# Patient Record
Sex: Female | Born: 1990 | Race: White | Hispanic: No | Marital: Married | Smoking: Never smoker
Health system: Southern US, Community
[De-identification: ages and names within clinical notes are randomized; demographics above are authoritative.]

## PROBLEM LIST (undated history)

## (undated) ENCOUNTER — Inpatient Hospital Stay (HOSPITAL_COMMUNITY): Payer: Self-pay

## (undated) ENCOUNTER — Inpatient Hospital Stay (HOSPITAL_COMMUNITY): Payer: Managed Care, Other (non HMO)

## (undated) DIAGNOSIS — L255 Unspecified contact dermatitis due to plants, except food: Secondary | ICD-10-CM

## (undated) DIAGNOSIS — I739 Peripheral vascular disease, unspecified: Secondary | ICD-10-CM

## (undated) DIAGNOSIS — R87629 Unspecified abnormal cytological findings in specimens from vagina: Secondary | ICD-10-CM

## (undated) DIAGNOSIS — J310 Chronic rhinitis: Secondary | ICD-10-CM

## (undated) DIAGNOSIS — R8789 Other abnormal findings in specimens from female genital organs: Secondary | ICD-10-CM

## (undated) DIAGNOSIS — B009 Herpesviral infection, unspecified: Secondary | ICD-10-CM

## (undated) DIAGNOSIS — I7300 Raynaud's syndrome without gangrene: Secondary | ICD-10-CM

## (undated) HISTORY — DX: Chronic rhinitis: J31.0

## (undated) HISTORY — DX: Raynaud's syndrome without gangrene: I73.00

## (undated) HISTORY — DX: Unspecified contact dermatitis due to plants, except food: L25.5

## (undated) HISTORY — DX: Other abnormal findings in specimens from female genital organs: R87.89

## (undated) HISTORY — DX: Herpesviral infection, unspecified: B00.9

## (undated) HISTORY — DX: Peripheral vascular disease, unspecified: I73.9

---
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
## 2008-05-17 ENCOUNTER — Ambulatory Visit: Payer: Self-pay | Admitting: Family Medicine

## 2008-08-30 ENCOUNTER — Ambulatory Visit: Payer: Self-pay | Admitting: Family Medicine

## 2008-10-31 ENCOUNTER — Ambulatory Visit: Payer: Self-pay | Admitting: Family Medicine

## 2008-10-31 DIAGNOSIS — L255 Unspecified contact dermatitis due to plants, except food: Secondary | ICD-10-CM

## 2008-11-03 ENCOUNTER — Telehealth: Payer: Self-pay | Admitting: Family Medicine

## 2009-02-01 ENCOUNTER — Ambulatory Visit: Payer: Self-pay | Admitting: Family Medicine

## 2009-02-01 DIAGNOSIS — I739 Peripheral vascular disease, unspecified: Secondary | ICD-10-CM

## 2009-02-01 DIAGNOSIS — M79609 Pain in unspecified limb: Secondary | ICD-10-CM

## 2009-02-01 LAB — CONVERTED CEMR LAB
Bilirubin Urine: NEGATIVE
Specific Gravity, Urine: 1.025
WBC Urine, dipstick: NEGATIVE
pH: 6

## 2009-02-02 LAB — CONVERTED CEMR LAB
Anti Nuclear Antibody(ANA): NEGATIVE
BUN: 12 mg/dL (ref 6–23)
Basophils Absolute: 0 K/uL (ref 0.0–0.1)
Basophils Relative: 1 % (ref 0–1)
CO2: 26 meq/L (ref 19–32)
Calcium: 9.6 mg/dL (ref 8.4–10.5)
Chloride: 102 meq/L (ref 96–112)
Creatinine, Ser: 0.7 mg/dL (ref 0.40–1.20)
Eosinophils Absolute: 0.2 K/uL (ref 0.0–0.7)
Eosinophils Relative: 2 % (ref 0–5)
Glucose, Bld: 78 mg/dL (ref 70–99)
HCT: 41.3 % (ref 36.0–46.0)
Hemoglobin: 13.8 g/dL (ref 12.0–15.0)
Lymphocytes Relative: 39 % (ref 12–46)
Lymphs Abs: 2.9 K/uL (ref 0.7–4.0)
MCHC: 33.4 g/dL (ref 30.0–36.0)
MCV: 88.6 fL (ref 78.0–100.0)
Monocytes Absolute: 0.7 K/uL (ref 0.1–1.0)
Monocytes Relative: 9 % (ref 3–12)
Neutro Abs: 3.6 K/uL (ref 1.7–7.7)
Neutrophils Relative %: 49 % (ref 43–77)
Platelets: 255 K/uL (ref 150–400)
Potassium: 4.2 meq/L (ref 3.5–5.3)
RBC: 4.66 M/uL (ref 3.87–5.11)
RDW: 13 % (ref 11.5–15.5)
Sed Rate: 7 mm/h (ref 0–22)
Sodium: 140 meq/L (ref 135–145)
WBC: 7.4 10*3/microliter (ref 4.0–10.5)

## 2009-02-03 ENCOUNTER — Ambulatory Visit: Payer: Self-pay | Admitting: Family Medicine

## 2009-02-03 DIAGNOSIS — I7300 Raynaud's syndrome without gangrene: Secondary | ICD-10-CM

## 2009-02-06 ENCOUNTER — Telehealth: Payer: Self-pay | Admitting: Family Medicine

## 2009-04-03 ENCOUNTER — Ambulatory Visit: Payer: Self-pay | Admitting: Family Medicine

## 2009-04-19 ENCOUNTER — Ambulatory Visit: Payer: Self-pay | Admitting: Family Medicine

## 2009-04-19 ENCOUNTER — Other Ambulatory Visit: Admission: RE | Admit: 2009-04-19 | Discharge: 2009-04-19 | Payer: Self-pay | Admitting: Family Medicine

## 2009-04-19 ENCOUNTER — Encounter: Payer: Self-pay | Admitting: Family Medicine

## 2009-04-19 DIAGNOSIS — M940 Chondrocostal junction syndrome [Tietze]: Secondary | ICD-10-CM

## 2009-04-20 ENCOUNTER — Encounter: Payer: Self-pay | Admitting: Family Medicine

## 2009-04-21 ENCOUNTER — Ambulatory Visit: Payer: Self-pay | Admitting: Family Medicine

## 2009-04-25 ENCOUNTER — Telehealth: Payer: Self-pay | Admitting: Family Medicine

## 2009-04-26 ENCOUNTER — Encounter: Payer: Self-pay | Admitting: Cardiology

## 2009-04-26 ENCOUNTER — Ambulatory Visit: Payer: Self-pay | Admitting: Cardiology

## 2009-04-26 DIAGNOSIS — R002 Palpitations: Secondary | ICD-10-CM | POA: Insufficient documentation

## 2009-04-26 DIAGNOSIS — R8789 Other abnormal findings in specimens from female genital organs: Secondary | ICD-10-CM

## 2009-05-09 ENCOUNTER — Ambulatory Visit: Payer: Self-pay | Admitting: Obstetrics & Gynecology

## 2009-05-31 ENCOUNTER — Encounter: Payer: Self-pay | Admitting: Cardiology

## 2009-05-31 ENCOUNTER — Ambulatory Visit: Payer: Self-pay | Admitting: Cardiology

## 2009-06-07 ENCOUNTER — Ambulatory Visit: Payer: Self-pay | Admitting: Family Medicine

## 2009-06-12 ENCOUNTER — Ambulatory Visit: Payer: Self-pay | Admitting: Family Medicine

## 2009-11-02 ENCOUNTER — Ambulatory Visit: Payer: Self-pay | Admitting: Family Medicine

## 2010-03-14 ENCOUNTER — Ambulatory Visit: Payer: Self-pay | Admitting: Family Medicine

## 2010-04-03 ENCOUNTER — Telehealth: Payer: Self-pay | Admitting: Family Medicine

## 2010-07-26 ENCOUNTER — Ambulatory Visit: Payer: Self-pay | Admitting: Family Medicine

## 2010-09-18 NOTE — Letter (Signed)
Summary: Physical Form/[facility]  Physical Form/[facility]   Imported By: Lanelle Bal 01/05/2010 11:12:32  _____________________________________________________________________  External Attachment:    Type:   Image     Comment:   External Document

## 2010-09-18 NOTE — Assessment & Plan Note (Signed)
Summary: costochondritis   Vital Signs:  Patient profile:   20 year old female Menstrual status:  regular Height:      64.75 inches Weight:      117 pounds BMI:     19.69 O2 Sat:      99 % on Room air Pulse rate:   85 / minute BP sitting:   113 / 72  (left arm) Cuff size:   regular  Vitals Entered By: Payton Spark CMA (March 14, 2010 10:20 AM)  O2 Flow:  Room air CC: costchondritis x 3 days.   Primary Care Provider:  Seymour Bars DO  CC:  costchondritis x 3 days.Marland Kitchen  History of Present Illness: 20 yo WF presents for L sided chest pain.  This started 3 days ago after push mowing the yard.  She has not been doing any weight lifting anymore.  She has a cold (working at a daycare).  Her symptoms have improved.  She feels SOB -- can't take a deep breath in due to pain.  Advil has helped.  Has pain with lifting anything heavy.    Has hx of costochondritis from last year.  Denies sputum production, asthma hx or cough.      Current Medications (verified): 1)  Nifedipine 2% Ketoprofen 2% Gabapentin 3% Ketamine 2% .... Apply To Feet 2-3 Times A Day. 2)  Apri 0.15-30 Mg-Mcg Tabs (Desogestrel-Ethinyl Estradiol) .Marland Kitchen.. 1 Tab By Mouth Daily As Directed 3)  Flonase 50 Mcg/act Susp (Fluticasone Propionate) .... 2 Sprays Per Nostril Daily 4)  Zyrtec Allergy 10 Mg Caps (Cetirizine Hcl) .Marland Kitchen.. 1 Cap By Mouth Qpm  Allergies (verified): No Known Drug Allergies  Past History:  Past Medical History: Reviewed history from 06/12/2009 and no changes required. 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 Lives with mom, older brother and younger brother. No smoking at home. Parents are divorced. She is in community college then going to [employer] Working at [employer]. Alcohol Use -  yes Part Time   Review of Systems      See HPI  Physical Exam  General:  alert, well-developed, well-nourished, and well-hydrated.   Head:  normocephalic and atraumatic.   Eyes:  conjunctiva clear Mouth:  pharynx pink and moist.   Neck:  no masses.   Chest Wall:  L anterior costochondral tenderness over ribs 3-5 Lungs:  Normal respiratory effort, chest expands symmetrically. Lungs are clear to auscultation, no crackles or wheezes.  splinting on deep inspiration Heart:  Normal rate and regular rhythm. S1 and S2 normal without gallop, murmur, click, rub or other extra sounds. Msk:  full L UE ROM and strength Extremities:  no E/C/C Skin:  color normal.  no bruising or redness Cervical Nodes:  shotty anterior cervical chain LA Psych:  good eye contact, not anxious appearing, and not depressed appearing.     Impression & Recommendations:  Problem # 1:  COSTOCHONDRITIS (ICD-733.6) Secondary to push mowing and gardening 3 days ago. Treat with 10-14 days of RX NSAID, avoidance of heavy lifting , pushing or pulling and use of heating pad. May be able to do PT to prevent recurrence (since this is her 3rd episode). Call if not improving in 2-3 wks.  Complete Medication List: 1)  Nifedipine 2% Ketoprofen 2% Gabapentin 3% Ketamine 2%  .... Apply to feet 2-3 times a day. 2)  Apri  0.15-30 Mg-mcg Tabs (Desogestrel-ethinyl estradiol) .Marland Kitchen.. 1 tab by mouth daily as directed 3)  Flonase 50 Mcg/act Susp (Fluticasone propionate) .... 2 sprays per nostril daily 4)  Zyrtec Allergy 10 Mg Caps (Cetirizine hcl) .Marland Kitchen.. 1 cap by mouth qpm 5)  Ibuprofen 800 Mg Tabs (Ibuprofen) .Marland Kitchen.. 1 tab by mouth three times a day with meals x 14 days  Patient Instructions: 1)  Use RX ibuprofen 3 x a day with food x 10-14 days. 2)  Use heating pad for comfort.   3)  Avoid heavy upper body lifting. 4)  I will call you about starting PT program after inflammation has improved. Prescriptions: IBUPROFEN 800 MG TABS  (IBUPROFEN) 1 tab by mouth three times a day with meals x 14 days  #28 x 0   Entered and Authorized by:   Seymour Bars DO   Signed by:   Seymour Bars DO on 03/14/2010   Method used:   Electronically to        [facility]  [id number] (retail)       [address]       [place], [place]  [address]       Ph: [phone]       Fax: [phone]   RxID:   [id number]   Appended Document: costochondritis

## 2010-09-18 NOTE — Progress Notes (Signed)
Summary: Water therapy  Phone Note Call from Patient   Caller: Mom Summary of Call: Mother called asking if you think some form of water therapy will be better than PT for Pt. If so, mother would like referral. Please advise. Initial call taken by: Payton Spark CMA,  April 03, 2010 12:56 PM

## 2010-09-18 NOTE — Assessment & Plan Note (Signed)
Summary: poison oak   Vital Signs:  Patient profile:   20 year old female Menstrual status:  regular Height:      64.75 inches Weight:      118 pounds BMI:     19.86 O2 Sat:      99 % on Room air Pulse rate:   93 / minute BP sitting:   125 / 77  (left arm) Cuff size:   regular  Vitals Entered By: Payton Spark CMA (November 02, 2009 2:15 PM)  O2 Flow:  Room air CC: Poison oak B hands   Primary Care Provider:  Seymour Bars DO  CC:  Poison oak B hands.  History of Present Illness: 21 yo WF presents for poison oak on both hands after doing some gardening yesterday.  She had it really bad last year.  She used Ivarest this morning.  Not using anything else.  The rash burns and itches now.  Denies sore throat, runny nose or cough.    Current Medications (verified): 1)  Nifedipine 2% Ketoprofen 2% Gabapentin 3% Ketamine 2% .... Apply To Feet 2-3 Times A Day. 2)  Apri 0.15-30 Mg-Mcg Tabs (Desogestrel-Ethinyl Estradiol) .Marland Kitchen.. 1 Tab By Mouth Daily As Directed 3)  Flonase 50 Mcg/act Susp (Fluticasone Propionate) .... 2 Sprays Per Nostril Daily 4)  Zyrtec Allergy 10 Mg Caps (Cetirizine Hcl) .Marland Kitchen.. 1 Cap By Mouth Qpm  Allergies (verified): No Known Drug Allergies  Past History:  Past Medical History: Reviewed history from 06/12/2009 and no changes required. 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 Lives with mom, older brother and younger brother. No smoking at home. Parents are divorced. She is in community college then going to [employer] - [employer] Working at [employer]. Alcohol Use - yes Part Time   Review of Systems      See HPI  Physical Exam  General:  alert, well-developed, well-nourished, and well-hydrated.   Head:  normocephalic and atraumatic.   Nose:  no  rhinorrhea Mouth:  no lesions of the oral mucosa Neck:  no masses.   Lungs:  Normal respiratory effort, chest expands symmetrically. Lungs are clear to auscultation, no crackles or wheezes. Heart:  Normal rate and regular rhythm. S1 and S2 normal without gallop, murmur, click, rub or other extra sounds. Skin:  color normal.  tiny erythmatous vesicular linear rash on both hands including web spaces Cervical Nodes:  No lymphadenopathy noted   Impression & Recommendations:  Problem # 1:  RHUS DERMATITIS (ICD-692.6)  Treat with solumedrol 125 mg IM today followed by oral steroid taper tomorrow. Wash with luke warm water and soap.  Apply topical caladryl to dry out lesions. Avoid further contacts with poison oak. Her updated medication list for this problem includes:    Zyrtec Allergy 10 Mg Caps (Cetirizine hcl) .Marland Kitchen... 1 cap by mouth qpm    Prednisone 20 Mg Tabs (Prednisone) .Marland Kitchen... 2 tabs by mouth qam x 3 days then 1 tab by mouth qam x 3 days then 1/2 tab by mouth qam x 3 days  Orders: Solumedrol up to 125mg  (E4540) Admin of Therapeutic Inj  intramuscular or subcutaneous (98119)  Complete Medication List: 1)  Nifedipine 2% Ketoprofen 2% Gabapentin 3% Ketamine 2%  .... Apply to feet 2-3 times a day. 2)  Apri 0.15-30 Mg-mcg Tabs (Desogestrel-ethinyl estradiol) .Marland Kitchen.. 1 tab by mouth daily as directed 3)  Flonase 50 Mcg/act Susp (Fluticasone propionate) .... 2 sprays per nostril daily 4)  Zyrtec Allergy 10 Mg Caps (Cetirizine hcl) .Marland Kitchen.. 1 cap by mouth qpm 5)  Prednisone 20 Mg Tabs (Prednisone) .... 2 tabs by mouth qam x 3 days then 1 tab by mouth qam x 3 days then 1/2 tab by mouth qam x 3 days  Patient Instructions: 1)  Steroid shot today. 2)  Start prednisone taper tomorrow. 3)  Wash with lukewarm soap and water. 4)  Apply topical caladryl. 5)  Call if not improving after 7 days. Prescriptions: PREDNISONE 20 MG TABS (PREDNISONE) 2 tabs by mouth qAM x 3 days then 1 tab by mouth qAM x 3 days  then 1/2 tab by mouth qAM x 3 days  #11 tabs x 0   Entered and Authorized by:   Seymour Bars DO   Signed by:   Seymour Bars DO on 11/02/2009   Method used:   Electronically to        [facility]  [id number] (retail)       [address]       [place], [place]  [address]       Ph: [phone]       Fax: [phone]   RxID:   [id number]    Medication Administration  Injection # 1:    Medication: Solumedrol up to 125mg     Diagnosis: RHUS DERMATITIS (ICD-692.6)    Route: IM    Site: RUOQ gluteus    Exp Date: 03/2011    Lot #: [id number] [id number]    Patient tolerated injection without complications    Given by: Payton Spark CMA (November 02, 2009 3:07 PM)  Orders Added: 1)  Est. Patient Level III [25956] 2)  Solumedrol up to 125mg  [J2930] 3)  Admin of Therapeutic Inj  intramuscular or subcutaneous [38756]

## 2010-09-20 NOTE — Miscellaneous (Signed)
Summary: PT Discharge/[facility]  PT Discharge/[facility]   Imported By: Lanelle Bal 08/09/2010 13:59:41  _____________________________________________________________________  External Attachment:    Type:   Image     Comment:   External Document

## 2011-04-03 ENCOUNTER — Encounter: Payer: Self-pay | Admitting: Cardiology

## 2011-05-10 ENCOUNTER — Encounter: Payer: Self-pay | Admitting: Family Medicine

## 2011-05-10 ENCOUNTER — Ambulatory Visit (INDEPENDENT_AMBULATORY_CARE_PROVIDER_SITE_OTHER): Payer: BC Managed Care – PPO | Admitting: Family Medicine

## 2011-05-10 ENCOUNTER — Other Ambulatory Visit (HOSPITAL_COMMUNITY)
Admission: RE | Admit: 2011-05-10 | Discharge: 2011-05-10 | Disposition: A | Discharge status: Home / Self Care | Payer: BC Managed Care – PPO | Source: Ambulatory Visit | Attending: Family Medicine | Admitting: Family Medicine

## 2011-05-10 VITALS — BP 118/70 | HR 89 | Ht 64.0 in | Wt 123.0 lb

## 2011-05-10 DIAGNOSIS — Z01419 Encounter for gynecological examination (general) (routine) without abnormal findings: Secondary | ICD-10-CM | POA: Insufficient documentation

## 2011-05-10 DIAGNOSIS — Z113 Encounter for screening for infections with a predominantly sexual mode of transmission: Secondary | ICD-10-CM | POA: Insufficient documentation

## 2011-05-10 DIAGNOSIS — Z3009 Encounter for other general counseling and advice on contraception: Secondary | ICD-10-CM

## 2011-05-10 DIAGNOSIS — R87619 Unspecified abnormal cytological findings in specimens from cervix uteri: Secondary | ICD-10-CM

## 2011-05-10 DIAGNOSIS — Z309 Encounter for contraceptive management, unspecified: Secondary | ICD-10-CM

## 2011-05-10 MED ORDER — NORELGESTROMIN-ETH ESTRADIOL 150-35 MCG/24HR TD PTWK: 1.0000 | MEDICATED_PATCH | TRANSDERMAL | Status: DC

## 2011-05-10 NOTE — Progress Notes (Signed)
  Subjective:    Patient ID: Annette Koch, female    DOB: [birth year], 20 y.o.   MRN: [id number]  HPI Also wants to discuss birth control options. She is due for her pap. She never followed up for her abnormal pap.    Her partner has herpes and now she has it. She wants to get on something daily at one point but wants to wait on her new insurance.    Review of Systems     Objective:   Physical Exam  Constitutional: She is oriented to person, place, and time. She appears well-developed and well-nourished.  Genitourinary: Vagina normal and uterus normal. There is no rash, tenderness, lesion or injury on the right labia. There is no rash, tenderness, lesion or injury on the left labia. Cervix exhibits no motion tenderness and no friability. Right adnexum displays no mass, no tenderness and no fullness. Left adnexum displays no mass, no tenderness and no fullness. No tenderness or bleeding around the vagina. No vaginal discharge found.  Neurological: She is alert and oriented to person, place, and time.          Assessment & Plan:  Contraceptive counseling - Discussed pills, patch, IUD, Mirena, implanon. Pt would like to try the patch. BP is well controlled. Call if any concerns.    Pap smear performed. Hx of abnormal. She never really followup.

## 2011-05-16 ENCOUNTER — Telehealth: Payer: Self-pay | Admitting: *Deleted

## 2011-05-16 NOTE — Telephone Encounter (Signed)
Message copied by Wyline Beady on Thu May 16, 2011  8:48 AM ------      Message from: Nani Gasser D      Created: Wed May 15, 2011  8:42 PM       Call pt: Pap is normal. Repeat in 1 years. Neg for GC and chlamydia.

## 2011-05-16 NOTE — Telephone Encounter (Signed)
Left message on vm

## 2011-06-29 ENCOUNTER — Emergency Department
Admit: 2011-06-29 | Discharge: 2011-06-29 | Disposition: A | Discharge status: Home / Self Care | Payer: BC Managed Care – PPO

## 2011-06-29 ENCOUNTER — Encounter: Payer: Self-pay | Admitting: Emergency Medicine

## 2011-06-29 ENCOUNTER — Emergency Department (INDEPENDENT_AMBULATORY_CARE_PROVIDER_SITE_OTHER)
Admission: EM | Admit: 2011-06-29 | Discharge: 2011-06-29 | Disposition: A | Discharge status: Home / Self Care | Payer: Self-pay | Source: Home / Self Care | Attending: Family Medicine | Admitting: Family Medicine

## 2011-06-29 DIAGNOSIS — S60222A Contusion of left hand, initial encounter: Secondary | ICD-10-CM

## 2011-06-29 DIAGNOSIS — S6992XA Unspecified injury of left wrist, hand and finger(s), initial encounter: Secondary | ICD-10-CM

## 2011-06-29 DIAGNOSIS — S60229A Contusion of unspecified hand, initial encounter: Secondary | ICD-10-CM
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
## 2011-07-05 ENCOUNTER — Ambulatory Visit (INDEPENDENT_AMBULATORY_CARE_PROVIDER_SITE_OTHER): Payer: BC Managed Care – PPO | Admitting: Family Medicine

## 2011-07-05 ENCOUNTER — Encounter: Payer: Self-pay | Admitting: Family Medicine

## 2011-07-05 DIAGNOSIS — R21 Rash and other nonspecific skin eruption: Secondary | ICD-10-CM

## 2011-07-05 DIAGNOSIS — Z309 Encounter for contraceptive management, unspecified: Secondary | ICD-10-CM

## 2011-07-05 MED ORDER — PREDNISONE 20 MG PO TABS: 40.0000 mg | ORAL_TABLET | Freq: Every day | ORAL | Status: DC

## 2011-07-05 NOTE — Progress Notes (Signed)
  Subjective:    Patient ID: Annette Koch, female    DOB: 1991/01/01, 20 y.o.   MRN: [id number]  HPI Rash sine last Tuesday. Has been using benadryl with no relief. Says it is very itchy. Started after cleaned out storage area. On her arms, back, torso, legs. Face is mostly spared.   Wants to discuss birth control options. She is interested in implanon. She is currently on the pill   Review of Systems     Objective:   Physical Exam  Constitutional: She is oriented to person, place, and time. She appears well-developed and well-nourished.  HENT:  Head: Normocephalic and atraumatic.  Cardiovascular: Normal rate, regular rhythm and normal heart sounds.   Pulmonary/Chest: Effort normal and breath sounds normal.  Neurological: She is alert and oriented to person, place, and time.  Skin: Skin is warm and dry.  Psychiatric: She has a normal mood and affect. Her behavior is normal.    Skin with fine pink papules on her arms, toso nad back. Most about about a 1 cm apart.       Assessment & Plan:  Rash- Likely allergic. Will tx wih oral prednisone since so diffuse on her body. Call if ot better in one week.   Contraceptive counseling - will refer to GYN for Implanon placement.

## 2011-07-10 ENCOUNTER — Other Ambulatory Visit: Payer: Self-pay | Admitting: Family Medicine

## 2011-07-17 ENCOUNTER — Ambulatory Visit (INDEPENDENT_AMBULATORY_CARE_PROVIDER_SITE_OTHER): Payer: BC Managed Care – PPO | Admitting: Family Medicine

## 2011-07-17 VITALS — BP 123/77 | HR 112 | Wt 122.0 lb

## 2011-07-17 DIAGNOSIS — R350 Frequency of micturition: Secondary | ICD-10-CM

## 2011-07-17 DIAGNOSIS — N390 Urinary tract infection, site not specified: Secondary | ICD-10-CM

## 2011-07-17 LAB — POCT URINALYSIS DIPSTICK
Bilirubin, UA: NEGATIVE
Glucose, UA: NEGATIVE
Spec Grav, UA: 1.015

## 2011-07-17 MED ORDER — SULFAMETHOXAZOLE-TRIMETHOPRIM 800-160 MG PO TABS: 1.0000 | ORAL_TABLET | Freq: Two times a day (BID) | ORAL | Status: AC

## 2011-07-17 NOTE — Progress Notes (Signed)
  Subjective:    Patient ID: Annette Koch, female    DOB: 01-25-1991, 20 y.o.   MRN: [id number] Pt here for urine sample. C/O burning with urination, urinary frequency, some hematuria, odor to urine. Sx's since yesterday HPI    Review of Systems     Objective:   Physical Exam        Assessment & Plan:  UTI- urinalysis is positive for blood and leukocytes. I will go ahead and treat for urinary tract infection. If her symptoms do not resolve after completing the antibiotic to please call her office. We'll treat with Bactrim one twice a day x3 days.

## 2011-07-18 NOTE — Progress Notes (Signed)
  Subjective:    Patient ID: Annette Koch, female    DOB: [birth year], 20 y.o.   MRN: [id number] Pt notified HPI    Review of Systems     Objective:   Physical Exam        Assessment & Plan:

## 2011-08-06 ENCOUNTER — Ambulatory Visit (INDEPENDENT_AMBULATORY_CARE_PROVIDER_SITE_OTHER): Payer: BC Managed Care – PPO | Admitting: Physician Assistant

## 2011-08-06 ENCOUNTER — Encounter: Payer: Self-pay | Admitting: Physician Assistant

## 2011-08-06 VITALS — BP 105/66 | HR 71 | Temp 98.5°F | Resp 16 | Ht 65.0 in | Wt 123.0 lb

## 2011-08-06 DIAGNOSIS — Z3009 Encounter for other general counseling and advice on contraception: Secondary | ICD-10-CM

## 2011-08-06 NOTE — Progress Notes (Signed)
Chief Complaint: Birth Control Consult  Annette Koch is  20 y.o. No obstetric history on file..  Patient's last menstrual period was 07/26/2011.. Presents to discuss Implanon for birth control. Recently married in November, currently using the [facility] patch. Desires change to Implanon for cost and ease of use.  Obstetrical/Gynecological History: OB History    Grav Para Term Preterm Abortions TAB SAB Ect Mult Living                 05/10/2011 Pap: NIL  Past Medical History: Past Medical History  Diagnosis Date  . PVD (peripheral vascular disease)     unspec  . Other abnormal Papanicolaou smear of cervix and cervical HPV   . Raynaud's disease   . Rhus dermatitis   . Rhinitis     allergic nos    Past Surgical History: Past Surgical History  Procedure Date  . Tonsillectomy and adenoidectomy 1994  . Cardiac surgery     Family History: Family History  Problem Relation Age of Onset  . Hypertension Maternal Grandmother     Social History: History  Substance Use Topics  . Smoking status: Never Smoker   . Smokeless tobacco: Not on file  . Alcohol Use: Yes     less often than weekly    Allergies: No Known Allergies   Review of Systems - Negative except what has been reviewed in the HPI History obtained from the patient  Physical Exam   Blood pressure 105/66, pulse 71, temperature 98.5 F (36.9 C), temperature source Oral, resp. rate 16, height 5\' 5"  (1.651 m), weight 123 lb (55.792 kg), last menstrual period 07/26/2011.  General: General appearance - alert, well appearing, and in no distress, oriented to person, place, and time and normal appearing weight Mental status - alert, oriented to person, place, and time, normal mood, behavior, speech, dress, motor activity, and thought processes, affect appropriate to mood Focused Gynecological Exam: examination not indicated     Assessment: Contraception Counselling  Plan: Reviewed Implanon procedure, bleeding  profile and side effects. Literature given for pt review RTC prn for placement with Dr. Penne Lash or Dr. Ranae Pila E. 08/06/2011,3:36 PM

## 2011-08-14 ENCOUNTER — Ambulatory Visit (INDEPENDENT_AMBULATORY_CARE_PROVIDER_SITE_OTHER): Payer: BC Managed Care – PPO | Admitting: Obstetrics & Gynecology

## 2011-08-14 ENCOUNTER — Encounter: Payer: Self-pay | Admitting: Obstetrics & Gynecology

## 2011-08-14 VITALS — BP 123/74 | HR 83 | Temp 97.7°F | Ht 64.0 in | Wt 125.0 lb

## 2011-08-14 DIAGNOSIS — Z30017 Encounter for initial prescription of implantable subdermal contraceptive: Secondary | ICD-10-CM

## 2011-08-14 MED ORDER — ETONOGESTREL 68 MG ~~LOC~~ IMPL
68.0000 mg | DRUG_IMPLANT | Freq: Once | SUBCUTANEOUS | Status: AC
  Administered 2011-08-14: 68 mg via SUBCUTANEOUS

## 2011-08-14 NOTE — Progress Notes (Signed)
Addended by: Chip Boer on: 08/14/2011 12:31 PM   Modules accepted: Orders

## 2011-08-14 NOTE — Progress Notes (Signed)
  Subjective:    Patient ID: Annette Koch, female    DOB: May 10, 1991, 20 y.o.   MRN: [id number]  HPI  She is here for Implanon insertion. She is aware that irregular bleeding is possible/likely.  Review of Systems Pap smear normal 9/12   Objective:   Physical Exam   Her left arm was prepped with betadine and infiltrated with 1% lidocaine. The implanon was inserted about 8 cm cephalad to the medial epicondyle. She tolerated the procedure well.     Assessment & Plan:  Contraception- Implanon inserted. RTC 1 year/prn sooner. She will continue to use condoms for 1 month.

## 2012-01-14 ENCOUNTER — Ambulatory Visit (INDEPENDENT_AMBULATORY_CARE_PROVIDER_SITE_OTHER): Payer: Managed Care, Other (non HMO) | Admitting: Family Medicine

## 2012-01-14 ENCOUNTER — Ambulatory Visit: Payer: Self-pay | Admitting: Family Medicine

## 2012-01-14 ENCOUNTER — Encounter: Payer: Self-pay | Admitting: Family Medicine

## 2012-01-14 VITALS — BP 118/74 | HR 69 | Temp 97.7°F | Ht 64.0 in | Wt 127.0 lb

## 2012-01-14 DIAGNOSIS — R0789 Other chest pain: Secondary | ICD-10-CM | POA: Insufficient documentation

## 2012-01-14 DIAGNOSIS — N61 Mastitis without abscess: Secondary | ICD-10-CM

## 2012-01-14 MED ORDER — DICLOXACILLIN SODIUM 250 MG PO CAPS: 250.0000 mg | ORAL_CAPSULE | Freq: Four times a day (QID) | ORAL | Status: AC

## 2012-01-14 NOTE — Progress Notes (Signed)
  Subjective:    Patient ID: Annette Koch, female    DOB: [birth year], 21 y.o.   MRN: [id number]  HPI Red spot on left breast for 3 days. Sore and tender.  Feels sensitive.  Now notices a knot there.  No family hx of breast cancer. No fever or trauma to the breast. She says she has been wearing sports bras recently then she is working at [employer]. She does not re-wear them.   Review of Systems     Objective:   Physical Exam  On the left breast at the 2:00 position there is a slight area of erythema. Does feel firm indurated underneath the skin in that area. It is tender to touch. I do not palpate any other nodules on the breast tissue. No axillary lymphadenopathy.      Assessment & Plan:  Mastitis - we will treat as an infection with dicloxacillin 250 mg 4 times a day x10 days. She has not had significant improvement, proximal is 75% within one week then please call the office back. At that time we'll get further imaging. No family history breast cancer and no other symptoms or signs to suspect this. Make sure wearing tight fitting bra. Make sure where brought in hot water to avoid bacterial contamination. I see no scan. She is not nursing.

## 2012-01-14 NOTE — Patient Instructions (Signed)
Mastitis   Mastitis is a bacterial infection of the breast tissue.  CAUSES   Bacteria causes infection by entering the breast tissue through cuts or openings in the skin. Typically, this occurs with breastfeeding due to cracked or irritated skin. It can be associated with plugged ducts. Nipple piercing can also lead to mastitis.  SYMPTOMS   In mastitis, an area of the breast becomes swollen, red, tender, and painful. You may notice you have a fever and swelling of the glands under your arm on that side. If the infection is allowed to progress, a collection of pus (abscess) may develop.  DIAGNOSIS   Your caregiver can diagnose mastitis based on your symptoms and upon examination. The diagnosis can be confirmed if pus can be expressed from the breast. This pus can be examined in the lab to determine which bacteria are present. If an abscess has developed, the fluid in the abscess can be removed with a needle. This is used to confirm the diagnosis and determine the bacteria present. In most cases, pus will not be present. Blood tests can be done to determine if your body is fighting a bacterial infection. Sometimes, a mammogram or ultrasound will be recommended to exclude other breast diseases including cancer.  Other rare forms of mastitis:   Tuberculosis mastitis is rare. The TB germ can affect the breast if it is present in some other part of the body. The breast may be slightly tender with a mass, but not tender or painful.   Syphilis of the nipple usually has an ulcer that is not tender.   Actinomycosis is a very rare bacterial infection of the breast that presents as a mass in the breast that is not tender or painful.   Phlebitis (inflammation of blood vessels) of the breast is an inflammation of the veins in the breast. It may be caused by tight fitting bras, surgery, or trauma to the breast.   Inflammatory carcinoma of the breast looks like mastitis because the breasts are red, swollen, or tender, but it  is a rare form of breast cancer.  TREATMENT   Antibiotic medication is used to treat the bacterial infection. Your caregiver will determine which bacteria are most likely to be causing the infection and select an antibiotic. This is sometimes changed based on the results of cultures, or if there is no response to the antibiotic selected. Antibiotics are usually given by mouth. If you are breastfeeding, it is important to continue to empty the breast. Your caregiver can tell you whether or not this milk is safe for your infant, or needs to be thrown away. Pain can usually be treated with medication.  HOME CARE INSTRUCTIONS    Take your antibiotics as directed. Finish them even if you start to feel better.   Only take over-the-counter or prescription medication for pain, discomfort, or fever as directed by your caregiver.   If breastfeeding, keep your nipples clean and dry. Your caregiver may tell you to stop nursing until he or she feels it is safe for your baby. Use a breast pump as instructed if forced to stop nursing.   Do not wear a tight bra. Wear a good support bra.   Empty the first breast completely before going to the other breast. If your baby is not emptying your breasts completely for some reason, use a breast pump to empty your breasts.   If you go back to work, pump your breasts while at work to stay in time   with your nursing schedule.   Increase your fluid intake especially if you have a fever.   Avoid having your breasts get overly filled with milk (engorged).  SEEK MEDICAL CARE IF:    You develop pus-like (purulent) discharge from the breast.   Your symptoms get worse.   You do not seem to be responding to your treatment within 2 days.  SEEK IMMEDIATE MEDICAL CARE IF:    You have a fever.   Your pain and swelling is getting worse.   You develop pain that is not controlled with medicine.   You develop a red line extending from the breast toward your armpit.  Document Released:  08/05/2005 Document Revised: 07/25/2011 Document Reviewed: 03/25/2008  [employer] Patient Information 2012 [employer].

## 2012-04-29 ENCOUNTER — Encounter: Payer: Self-pay | Admitting: Family Medicine

## 2012-04-29 ENCOUNTER — Ambulatory Visit (INDEPENDENT_AMBULATORY_CARE_PROVIDER_SITE_OTHER): Payer: Managed Care, Other (non HMO) | Admitting: Family Medicine

## 2012-04-29 VITALS — BP 118/73 | HR 81 | Temp 98.9°F | Wt 134.0 lb

## 2012-04-29 DIAGNOSIS — N912 Amenorrhea, unspecified: Secondary | ICD-10-CM

## 2012-04-29 DIAGNOSIS — M2550 Pain in unspecified joint: Secondary | ICD-10-CM

## 2012-04-29 DIAGNOSIS — Z0000 Encounter for general adult medical examination without abnormal findings: Secondary | ICD-10-CM

## 2012-04-29 LAB — COMPLETE METABOLIC PANEL WITH GFR
ALT: 18 U/L (ref 0–35)
CO2: 24 mEq/L (ref 19–32)
Calcium: 9.1 mg/dL (ref 8.4–10.5)
Chloride: 105 mEq/L (ref 96–112)
GFR, Est African American: >89 mL/min
Sodium: 139 mEq/L (ref 135–145)
Total Protein: 7.3 g/dL (ref 6.0–8.3)

## 2012-04-29 LAB — CBC WITH DIFFERENTIAL/PLATELET
Basophils Absolute: 0 10*3/uL (ref 0.0–0.1)
Eosinophils Absolute: 0.2 10*3/uL (ref 0.0–0.7)
Eosinophils Relative: 3 % (ref 0–5)
HCT: 42 % (ref 36.0–46.0)
Lymphocytes Relative: 33 % (ref 12–46)
MCH: 29.3 pg (ref 26.0–34.0)
MCHC: 33.8 g/dL (ref 30.0–36.0)
MCV: 86.8 fL (ref 78.0–100.0)
Monocytes Absolute: 0.6 10*3/uL (ref 0.1–1.0)
Platelets: 271 10*3/uL (ref 150–400)
RDW: 13.2 % (ref 11.5–15.5)
WBC: 7.6 10*3/uL (ref 4.0–10.5)

## 2012-04-29 LAB — LIPID PANEL
HDL: 48 mg/dL (ref >39)
LDL Cholesterol: 96 mg/dL (ref 0–99)

## 2012-04-29 LAB — HCG, QUANTITATIVE, PREGNANCY: hCG, Beta Chain, Quant, S: <2 m[IU]/mL

## 2012-04-29 LAB — POCT URINE PREGNANCY: Preg Test, Ur: POSITIVE

## 2012-04-29 NOTE — Patient Instructions (Addendum)
We will call you with your lab results. If you don't here from us in about a week then please give us a call at [phone]. Start a regular exercise program and make sure you are eating a healthy diet Try to eat 4 servings of dairy a day . Your vaccines are up to date.   

## 2012-04-29 NOTE — Progress Notes (Signed)
Subjective:     Annette Koch is a 21 y.o. female and is here for a comprehensive physical exam. The patient reports problems - thinks she is pregnant but has implannon in. Skipped her period but feels bloated and bresats are tender and has noticed some weight gain. Marland Kitchen  History   Social History  . Marital Status: Single    Spouse Name: N/A    Number of Children: N/A  . Years of Education: N/A   Occupational History  . Not on file.   Social History Main Topics  . Smoking status: Never Smoker   . Smokeless tobacco: Not on file  . Alcohol Use: Yes     less often than weekly  . Drug Use: No  . Sexually Active: Yes    Birth Control/ Protection: Condom   Other Topics Concern  . Not on file   Social History Narrative  . No narrative on file   Health Maintenance  Topic Date Due  . Influenza Vaccine  05/19/2012  . Pap Smear  05/09/2014  . Tetanus/tdap  08/19/2017    The following portions of the patient's history were reviewed and updated as appropriate: allergies, current medications, past family history, past medical history, past social history, past surgical history and problem list.  Review of Systems A comprehensive review of systems was negative.  except for HPI.   Objective:    BP 118/73  Pulse 81  Temp 98.9 F (37.2 C)  Wt 134 lb (60.782 kg)  LMP 03/19/2012 General appearance: alert, cooperative and appears stated age Head: Normocephalic, without obvious abnormality, atraumatic Eyes: conj clear, EOMI, PEERLA Ears: normal TM's and external ear canals both ears Nose: Nares normal. Septum midline. Mucosa normal. No drainage or sinus tenderness. Throat: lips, mucosa, and tongue normal; teeth and gums normal Neck: no adenopathy, no carotid bruit, no JVD, supple, symmetrical, trachea midline and thyroid not enlarged, symmetric, no tenderness/mass/nodules Back: symmetric, no curvature. ROM normal. No CVA tenderness. Lungs: clear to auscultation bilaterally Heart:  regular rate and rhythm, S1, S2 normal, no murmur, click, rub or gallop Abdomen: soft, non-tender; bowel sounds normal; no masses,  no organomegaly Extremities: extremities normal, atraumatic, no cyanosis or edema Pulses: 2+ and symmetric Skin: Skin color, texture, turgor normal. No rashes or lesions Lymph nodes: Cervical, supraclavicular, and axillary nodes normal. Neurologic: Alert and oriented X 3, normal strength and tone. Normal symmetric reflexes. Normal coordination and gait    Assessment:    Healthy female exam.      Plan:     See After Visit Summary for Counseling Recommendations  Start a regular exercise program and make sure you are eating a healthy diet Try to eat 4 servings of dairy a day or take a calcium supplement (500mg  twice a day). Your vaccines are up to date.   Amenorrhea-urine pregnancy test here was faintly positive. We'll get a serum quantitative for further evaluation. If it is positive then we'll need to get her to her GYN immediately to have her Implanon removed as well as get an ultrasound for dating. The first day of her last period was around August 4. The she has had very irregular periods and she had been on placed in December of 2012.  She also complains of some aching in her joints. Especially in her hands and her feet. She says that they do occasionally swell. She does have a history of Raynaud's but says this is different. She's also been told that she does not have  any type of a mean disorder. I'm assuming that means that her sedimentation rate etc. is normal. I'll recheck a sedimentation rate today.  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  She declined flu vaccine today.  Due for screening lipid panel.

## 2012-05-25 ENCOUNTER — Encounter: Payer: Self-pay | Admitting: Family Medicine

## 2012-05-25 ENCOUNTER — Ambulatory Visit (INDEPENDENT_AMBULATORY_CARE_PROVIDER_SITE_OTHER): Payer: Managed Care, Other (non HMO) | Admitting: Family Medicine

## 2012-05-25 VITALS — BP 112/64 | HR 77 | Temp 98.3°F | Wt 138.0 lb

## 2012-05-25 DIAGNOSIS — J069 Acute upper respiratory infection, unspecified: Secondary | ICD-10-CM

## 2012-05-25 DIAGNOSIS — M2550 Pain in unspecified joint: Secondary | ICD-10-CM

## 2012-05-25 NOTE — Patient Instructions (Signed)

## 2012-05-25 NOTE — Progress Notes (Signed)
Subjective:    Patient ID: Annette Koch, female    DOB: 1991-03-15, 21 y.o.   MRN: [id number]  HPI ST x 4 days. Very congested.  No fever.  + facial pressure.  Slight cough.  +HA.  No meds. No GI sxs.   Hands have been really sore and stiff and feels like it ishard to rip. Says some days will hurt and feel stiff all day. Unable to really quantify timeframe for stiffness. Says whole hand will swell, not distinct joints. Says thumbs and pointer fingers are the worse.  Hx of raynauds. No fam hx of rheumatoid arthritis.  Says this is not her raynauds. No other aggravating or alleviating symptoms.   Review of Systems     BP 112/64  Pulse 77  Temp 98.3 F (36.8 C)  Wt 138 lb (62.596 kg)  SpO2 96%  LMP 03/19/2012    No Known Allergies  Past Medical History  Diagnosis Date  . PVD (peripheral vascular disease)     unspec  . Other abnormal Papanicolaou smear of cervix and cervical HPV(795.09)   . Raynaud's disease   . Rhus dermatitis   . Rhinitis     allergic nos    Past Surgical History  Procedure Date  . Tonsillectomy and adenoidectomy 1994  . Cardiac surgery     History   Social History  . Marital Status: Single    Spouse Name: N/A    Number of Children: N/A  . Years of Education: N/A   Occupational History  . Not on file.   Social History Main Topics  . Smoking status: Never Smoker   . Smokeless tobacco: Not on file  . Alcohol Use: Yes     less often than weekly  . Drug Use: No  . Sexually Active: Yes    Birth Control/ Protection: Condom   Other Topics Concern  . Not on file   Social History Narrative  . No narrative on file    Family History  Problem Relation Age of Onset  . Hypertension Maternal Grandmother     Outpatient Encounter Prescriptions as of 05/25/2012  Medication Sig Dispense Refill  . Etonogestrel (IMPLANON [place]) Inject into the skin.           Objective:   Physical Exam  Constitutional: She is oriented to person, place, and  time. She appears well-developed and well-nourished.  HENT:  Head: Normocephalic and atraumatic.  Right Ear: External ear normal.  Left Ear: External ear normal.  Nose: Nose normal.  Mouth/Throat: Oropharynx is clear and moist.       TMs and canals are clear.   Eyes: Conjunctivae normal and EOM are normal. Pupils are equal, round, and reactive to light.  Neck: Neck supple. No thyromegaly present.  Cardiovascular: Normal rate, regular rhythm and normal heart sounds.   Pulmonary/Chest: Effort normal and breath sounds normal. She has no wheezes.  Musculoskeletal:       Hands appear normal with no swelling of the joints. Normal range of motion.  Lymphadenopathy:    She has cervical adenopathy.  Neurological: She is alert and oriented to person, place, and time.  Skin: Skin is warm and dry.  Psychiatric: She has a normal mood and affect.          Assessment & Plan:  URI - rapid strep neg. Likely viral. Call if not better by Friday, or sooner if she feels worse or has a fever.  Joint pain and stiffness - will check ANA,  sed rate, and rheumatoid factor.  Question whether not she could have some type of rheumatoid arthritis and she also has Raynaud's. Her ANA has been negative in the past. She has never had a rheumatoid factor checked. There's no swelling in her hands today.

## 2012-05-26 LAB — SEDIMENTATION RATE: Sed Rate: 4 mm/hr (ref 0–22)

## 2012-06-11 ENCOUNTER — Encounter: Payer: Self-pay | Admitting: Sports Medicine

## 2012-06-11 ENCOUNTER — Ambulatory Visit (INDEPENDENT_AMBULATORY_CARE_PROVIDER_SITE_OTHER): Payer: Managed Care, Other (non HMO) | Admitting: Sports Medicine

## 2012-06-11 ENCOUNTER — Ambulatory Visit (INDEPENDENT_AMBULATORY_CARE_PROVIDER_SITE_OTHER): Payer: Managed Care, Other (non HMO)

## 2012-06-11 VITALS — BP 122/68 | HR 91 | Wt 138.0 lb

## 2012-06-11 DIAGNOSIS — R209 Unspecified disturbances of skin sensation: Secondary | ICD-10-CM

## 2012-06-11 DIAGNOSIS — M7918 Myalgia, other site: Secondary | ICD-10-CM | POA: Insufficient documentation

## 2012-06-11 DIAGNOSIS — IMO0001 Reserved for inherently not codable concepts without codable children: Secondary | ICD-10-CM

## 2012-06-11 DIAGNOSIS — I7300 Raynaud's syndrome without gangrene: Secondary | ICD-10-CM

## 2012-06-11 MED ORDER — GABAPENTIN 300 MG PO CAPS: ORAL_CAPSULE | ORAL | Status: DC

## 2012-06-11 NOTE — Progress Notes (Signed)
Subjective:    I'm seeing this patient as a consultation for:  Dr. Linford Arnold  CC: Pains  HPI: Annette Koch is a very pleasant 21 year old female with a history of Raynaud's disease comes in with a long history of pain at multiple sites on her body. She notes pain in her neck, both shoulders, both hips. She also notes occasional burning/numbness/tingling type sensation that travels down both arms into both hands and the first 3 fingers. She denies any recent trauma. She has already been tested for mixed connective tissue and collagen vascular disease, and this negative. She is also seen a pediatrician who suspected fibromyalgia, however she has declined treatment. She denies any fevers, chills, night sweats, weight loss. She denies any tick bites. Symptoms are mild, and do not interfere with her daily activities.  Past medical history, Surgical history, Family history, Social history, Allergies, and medications have been entered into the medical record, reviewed, and no changes needed.   Review of Systems: No headache, visual changes, nausea, vomiting, diarrhea, constipation, dizziness, abdominal pain, skin rash, fevers, chills, night sweats, weight loss, swollen lymph nodes, body aches, joint swelling, muscle aches, chest pain, or shortness of breath.   Objective:   Vitals:  Afebrile, vital signs stable. General: Well Developed, well nourished, and in no acute distress.  Neuro/Psych: Alert and oriented x3, extra-ocular muscles intact, able to move all 4 extremities.  Skin: Warm and dry, no rashes noted.  Respiratory: Not using accessory muscles, speaking in full sentences, trachea midline.  Cardiovascular: Pulses palpable, no extremity edema. Abdomen: Does not appear distended. Neck: Inspection unremarkable. No palpable stepoffs. Negative Spurling's maneuver. Full neck range of motion Grip strength and sensation normal in bilateral hands Strength good C4 to T1 distribution No sensory change to C4  to T1 Negative Hoffman sign bilaterally Reflexes normal  Bilateral Shoulder: Inspection reveals no abnormalities, atrophy or asymmetry. Palpation is normal with no tenderness over AC joint or bicipital groove. ROM is full in all planes. Rotator cuff strength normal throughout. No signs of impingement with negative Neer and Hawkin's tests, empty can sign. Speeds and Yergason's tests normal. No labral pathology noted with negative Obrien's, negative clunk and good stability. Normal scapular function observed. No painful arc and no drop arm sign. No apprehension sign  Impression and Recommendations:   This case required medical decision making of moderate complexity.

## 2012-06-11 NOTE — Assessment & Plan Note (Signed)
Also discussed this, and her symptoms are not severe enough to warrant daily calcium channel blocker therapy.

## 2012-06-11 NOTE — Assessment & Plan Note (Signed)
Certainly with numbness and tingling in both hands, in a C6 distribution I do need to image her neck. I do however think that this will eventually represent a myofascial pain syndrome, and she did agree to start gabapentin taper. She declines Lyrica or Cymbalta. I would like to see her back in 4 weeks to see how things are going. Dr. Linford Arnold is already done an excellent job of ruling out various mixed connective tissue disorders.

## 2012-06-12 ENCOUNTER — Telehealth: Payer: Self-pay | Admitting: *Deleted

## 2012-06-12 NOTE — Telephone Encounter (Signed)
Pt asking about her xray results.

## 2012-06-12 NOTE — Telephone Encounter (Signed)
Looks ok except slight decreased disc space at the C7-T1 level.  Doesn't change our plan.

## 2012-06-15 NOTE — Telephone Encounter (Signed)
Pt informed

## 2012-07-09 ENCOUNTER — Encounter: Payer: Self-pay | Admitting: Sports Medicine

## 2012-07-09 ENCOUNTER — Ambulatory Visit (INDEPENDENT_AMBULATORY_CARE_PROVIDER_SITE_OTHER): Payer: Managed Care, Other (non HMO) | Admitting: Sports Medicine

## 2012-07-09 VITALS — BP 116/71 | HR 88 | Wt 139.0 lb

## 2012-07-09 DIAGNOSIS — IMO0001 Reserved for inherently not codable concepts without codable children: Secondary | ICD-10-CM

## 2012-07-09 DIAGNOSIS — M7918 Myalgia, other site: Secondary | ICD-10-CM

## 2012-07-09 NOTE — Progress Notes (Signed)
SPORTS MEDICINE CONSULTATION REPORT  Subjective:    CC: Myofascial pain  HPI: I am seeing Annette Koch for myofascial pain syndrome, I saw her recently and she had pain in her shoulders, with numbness and tingling running down both arms. We imaged her neck which was negative. I also started her on gabapentin up taper. She returns today with symptoms resolved, and very happy with results.  Past medical history, Surgical history, Family history, Social history, Allergies, and medications have been entered into the medical record, reviewed, and no changes needed.   Review of Systems: No headache, visual changes, nausea, vomiting, diarrhea, constipation, dizziness, abdominal pain, skin rash, fevers, chills, night sweats, weight loss, swollen lymph nodes, body aches, joint swelling, muscle aches, chest pain, or shortness of breath.   Objective:   Vitals:  Afebrile, vital signs stable. General: Well Developed, well nourished, and in no acute distress.  Neuro/Psych: Alert and oriented x3, extra-ocular muscles intact, able to move all 4 extremities.  Skin: Warm and dry, no rashes noted.  Respiratory: Not using accessory muscles, speaking in full sentences, trachea midline.  Cardiovascular: Pulses palpable, no extremity edema. Abdomen: Does not appear distended. Neck: Inspection unremarkable. No palpable stepoffs. Negative Spurling's maneuver. Full neck range of motion Grip strength and sensation normal in bilateral hands Strength good C4 to T1 distribution No sensory change to C4 to T1 Negative Hoffman sign bilaterally Reflexes normal  Impression and Recommendations:   This case required medical decision making of moderate complexity.

## 2012-07-09 NOTE — Assessment & Plan Note (Signed)
Symptoms are significantly better with low-dose gabapentin 300 mg 2-3 times per day. At this point I don't think that we need to increase her dose. We will continue from here with no changes, and I can see her back in 3 months. I happy to see her sooner if needed.

## 2012-10-05 ENCOUNTER — Ambulatory Visit (INDEPENDENT_AMBULATORY_CARE_PROVIDER_SITE_OTHER): Payer: Managed Care, Other (non HMO) | Admitting: Physician Assistant

## 2012-10-05 ENCOUNTER — Encounter: Payer: Self-pay | Admitting: Physician Assistant

## 2012-10-05 VITALS — BP 118/67 | HR 93 | Wt 134.0 lb

## 2012-10-05 DIAGNOSIS — J0100 Acute maxillary sinusitis, unspecified: Secondary | ICD-10-CM

## 2012-10-05 MED ORDER — AMOXICILLIN-POT CLAVULANATE 875-125 MG PO TABS: 1.0000 | ORAL_TABLET | Freq: Two times a day (BID) | ORAL | Status: DC

## 2012-10-05 NOTE — Patient Instructions (Addendum)

## 2012-10-05 NOTE — Progress Notes (Signed)
  Subjective:    Patient ID: Annette Koch, female    DOB: November 10, 1990, 22 y.o.   MRN: [id number]  Sinusitis This is a new problem. The current episode started in the past 7 days. The problem has been gradually worsening since onset. There has been no fever. Her pain is at a severity of 6/10. The pain is moderate. Associated symptoms include congestion, coughing, ear pain, headaches and sinus pressure. Pertinent negatives include no chills, diaphoresis, hoarse voice, neck pain, shortness of breath, sneezing, sore throat or swollen glands. (Cough is productive Bilateral ear pain with left greater than right with a lot of pressure. ) Treatments tried: Mucinex D.  The treatment provided mild (Felt a little better then started to get worse. ) relief.       Review of Systems  Constitutional: Negative for chills and diaphoresis.  HENT: Positive for ear pain, congestion and sinus pressure. Negative for sore throat, hoarse voice, sneezing and neck pain.   Respiratory: Positive for cough. Negative for shortness of breath.   Neurological: Positive for headaches.       Objective:   Physical Exam  Constitutional: She appears well-developed and well-nourished.  HENT:  Head: Normocephalic and atraumatic.  Right TM some dullness at the base. Still able to view ossicles and has a good light reflex. Left TM clear.   Oropharynx is erythematous with PND present no tonsils removed.   Maxillary tenderness bilaterally to palpation.   Eyes: Conjunctivae are normal. Right eye exhibits no discharge. Left eye exhibits no discharge.  Neck: Normal range of motion. Neck supple.  Cardiovascular: Normal rate, regular rhythm and normal heart sounds.   Pulmonary/Chest: Effort normal and breath sounds normal. She has no wheezes.  Lymphadenopathy:    She has no cervical adenopathy.  Skin: Skin is warm and dry. No rash noted. No erythema. No pallor.  Psychiatric: She has a normal mood and affect. Her behavior is  normal.          Assessment & Plan:  Sinusitis- Given Augmentin for 10 days. Pt already has nasal spray that she was instructed to use daily. Continue mucinex for next 24 hours. Gave handout. Call if not improving.

## 2012-10-09 ENCOUNTER — Ambulatory Visit: Payer: Managed Care, Other (non HMO) | Admitting: Sports Medicine

## 2012-10-09 DIAGNOSIS — Z0289 Encounter for other administrative examinations: Secondary | ICD-10-CM

## 2013-01-18 ENCOUNTER — Ambulatory Visit (INDEPENDENT_AMBULATORY_CARE_PROVIDER_SITE_OTHER): Payer: Managed Care, Other (non HMO) | Admitting: Family Medicine

## 2013-01-18 ENCOUNTER — Ambulatory Visit (HOSPITAL_BASED_OUTPATIENT_CLINIC_OR_DEPARTMENT_OTHER)
Admission: RE | Admit: 2013-01-18 | Discharge: 2013-01-18 | Disposition: A | Discharge status: Home / Self Care | Payer: Managed Care, Other (non HMO) | Source: Ambulatory Visit | Attending: Family Medicine | Admitting: Family Medicine

## 2013-01-18 ENCOUNTER — Encounter (HOSPITAL_BASED_OUTPATIENT_CLINIC_OR_DEPARTMENT_OTHER): Payer: Self-pay

## 2013-01-18 ENCOUNTER — Encounter: Payer: Self-pay | Admitting: Family Medicine

## 2013-01-18 VITALS — BP 111/79 | HR 78 | Wt 129.0 lb

## 2013-01-18 DIAGNOSIS — R1031 Right lower quadrant pain: Secondary | ICD-10-CM | POA: Insufficient documentation

## 2013-01-18 DIAGNOSIS — R110 Nausea: Secondary | ICD-10-CM | POA: Insufficient documentation

## 2013-01-18 DIAGNOSIS — R319 Hematuria, unspecified: Secondary | ICD-10-CM

## 2013-01-18 LAB — POCT URINALYSIS DIPSTICK
Bilirubin, UA: NEGATIVE
Glucose, UA: NEGATIVE
Ketones, UA: NEGATIVE
Leukocytes, UA: NEGATIVE
Nitrite, UA: NEGATIVE

## 2013-01-18 MED ORDER — IOHEXOL 300 MG/ML  SOLN
100.0000 mL | Freq: Once | INTRAMUSCULAR | Status: AC | PRN
  Administered 2013-01-18: 100 mL via INTRAVENOUS

## 2013-01-18 NOTE — Progress Notes (Signed)
Subjective:    Patient ID: Annette Koch, female    DOB: Sep 17, 1990, 22 y.o.   MRN: [id number]  HPI Having sharp RLQ Pain. Says feels similar to when had problems with her cyst. Says that is why she went on OCP. Hasn't felt nauseated with the pain. Has had loser bowels.  Says her period is ending. Last over a week and was heavier this time. Says not cramping.  No new sexual partners. No fever, chills, or sweats. Worse with standing for awhile. Feels better whenb sitting.  Stretching irritates it. More bloated than usual.  More uncomfortable if she jumps.    Review of Systems BP 111/79  Pulse 78  Wt 129 lb (58.514 kg)  BMI 22.13 kg/m2  LMP 01/11/2013    No Known Allergies  Past Medical History  Diagnosis Date  . PVD (peripheral vascular disease)     unspec  . Other abnormal Papanicolaou smear of cervix and cervical HPV(795.09)   . Raynaud's disease   . Rhus dermatitis   . Rhinitis     allergic nos    Past Surgical History  Procedure Laterality Date  . Tonsillectomy and adenoidectomy  1994  . Cardiac surgery      History   Social History  . Marital Status: Single    Spouse Name: N/A    Number of Children: N/A  . Years of Education: N/A   Occupational History  . Not on file.   Social History Main Topics  . Smoking status: Never Smoker   . Smokeless tobacco: Not on file  . Alcohol Use: Yes     Comment: less often than weekly  . Drug Use: No  . Sexually Active: Yes    Birth Control/ Protection: Condom   Other Topics Concern  . Not on file   Social History Narrative  . No narrative on file    Family History  Problem Relation Age of Onset  . Hypertension Maternal Grandmother     Outpatient Encounter Prescriptions as of 01/18/2013  Medication Sig Dispense Refill  . Etonogestrel (IMPLANON [place]) Inject into the skin.      Marland Kitchen gabapentin (NEURONTIN) 300 MG capsule One tab PO qHS for a week, then BID for a week, then TID.  May increase to a max of 4 tabs PO TID.   180 capsule  3  . [DISCONTINUED] amoxicillin-clavulanate (AUGMENTIN) 875-125 MG per tablet Take 1 tablet by mouth 2 (two) times daily. For 10 days.  20 tablet  0   No facility-administered encounter medications on file as of 01/18/2013.          Objective:   Physical Exam  Constitutional: She is oriented to person, place, and time. She appears well-developed and well-nourished.  HENT:  Head: Normocephalic and atraumatic.  Cardiovascular: Normal rate, regular rhythm and normal heart sounds.   Pulmonary/Chest: Effort normal and breath sounds normal.  Abdominal: Soft. Bowel sounds are normal. She exhibits no distension and no mass. There is tenderness. There is no rebound and no guarding.  TTP RLQ. No rebound or guarding.   Neurological: She is alert and oriented to person, place, and time.  Skin: Skin is warm and dry.  Psychiatric: She has a normal mood and affect. Her behavior is normal.          Assessment & Plan:  RLQ pain - Will schedule for CT to evaluate the appendix and the ovary.  Will do UPT first. UPT is neg. Will call with results. Can use  Tylenol or IBU.

## 2013-01-20 LAB — URINE CULTURE: Colony Count: NO GROWTH

## 2013-02-03 ENCOUNTER — Encounter: Payer: Self-pay | Admitting: Obstetrics & Gynecology

## 2013-02-03 ENCOUNTER — Ambulatory Visit (INDEPENDENT_AMBULATORY_CARE_PROVIDER_SITE_OTHER): Payer: Managed Care, Other (non HMO) | Admitting: Obstetrics & Gynecology

## 2013-02-03 VITALS — BP 116/75 | HR 80 | Resp 16 | Ht 64.0 in | Wt 129.0 lb

## 2013-02-03 DIAGNOSIS — Z3046 Encounter for surveillance of implantable subdermal contraceptive: Secondary | ICD-10-CM

## 2013-02-03 DIAGNOSIS — IMO0001 Reserved for inherently not codable concepts without codable children: Secondary | ICD-10-CM

## 2013-02-03 MED ORDER — NORELGESTROMIN-ETH ESTRADIOL 150-35 MCG/24HR TD PTWK: 1.0000 | MEDICATED_PATCH | TRANSDERMAL | Status: DC

## 2013-02-03 NOTE — Progress Notes (Signed)
  Subjective:    Patient ID: Annette Koch, female    DOB: 15-Jun-1991, 22 y.o.   MRN: [id number]  HPI  22 yo MWG0 who is here today to have her Nexplanon removed. She was diagnosed with an ovarian cyst at [facility] and she believes that by restarting Ortho [facility] she will cause her ovarian cyst to shrink faster. She and her husband were planning to attempt pregnancy after the cyst resolves. I have discussed that she should start folic acid/mvi now to prevent ONTDs. She is unsure whether or not she wants to restart the patch but she would like a prescription called to her pharmacy.   Review of Systems She says that she has completed her Gardasil series. Her pap is due.    Objective:   Physical Exam  Consent signed. Time out done. Her left arm was prepped with betadine and infiltrated with 1% lidocaine. A small incision was made and the entire Nexplanon was easily removed and noted to be intact. She tolerated the procedure well.      Assessment & Plan:  As above RTC 4 weeks for annual exam

## 2013-03-04 ENCOUNTER — Encounter: Payer: Self-pay | Admitting: Obstetrics & Gynecology

## 2013-03-04 ENCOUNTER — Ambulatory Visit (INDEPENDENT_AMBULATORY_CARE_PROVIDER_SITE_OTHER): Payer: Managed Care, Other (non HMO) | Admitting: Obstetrics & Gynecology

## 2013-03-04 VITALS — BP 118/77 | HR 81 | Resp 16 | Ht 64.5 in | Wt 129.0 lb

## 2013-03-04 DIAGNOSIS — Z01419 Encounter for gynecological examination (general) (routine) without abnormal findings: Secondary | ICD-10-CM

## 2013-03-04 DIAGNOSIS — Z113 Encounter for screening for infections with a predominantly sexual mode of transmission: Secondary | ICD-10-CM

## 2013-03-04 DIAGNOSIS — Z124 Encounter for screening for malignant neoplasm of cervix: Secondary | ICD-10-CM

## 2013-03-04 DIAGNOSIS — Z0000 Encounter for general adult medical examination without abnormal findings: Secondary | ICD-10-CM

## 2013-03-04 DIAGNOSIS — Z1151 Encounter for screening for human papillomavirus (HPV): Secondary | ICD-10-CM

## 2013-03-04 NOTE — Progress Notes (Signed)
Subjective:    Annette Koch is a 22 y.o. female who presents for an annual exam. The patient has no complaints today. She quit using her Ortho Evra last month, taking PNVs. The patient is sexually active. GYN screening history: last pap: was normal. The patient wears seatbelts: yes. The patient participates in regular exercise: yes. Has the patient ever been transfused or tattooed?: no. The patient reports that there is not domestic violence in her life.   Menstrual History: OB History   Grav Para Term Preterm Abortions TAB SAB Ect Mult Living   1    1 1     0      Menarche age: 28  Patient's last menstrual period was 02/21/2013.    The following portions of the patient's history were reviewed and updated as appropriate: allergies, current medications, past family history, past medical history, past social history, past surgical history and problem list.  Review of Systems A comprehensive review of systems was negative. She is a Printmaker.   Objective:    BP 118/77  Pulse 81  Resp 16  Ht 5' 4.5" (1.638 m)  Wt 129 lb (58.514 kg)  BMI 21.81 kg/m2  LMP 02/21/2013  General Appearance:    Alert, cooperative, no distress, appears stated age  Head:    Normocephalic, without obvious abnormality, atraumatic  Eyes:    PERRL, conjunctiva/corneas clear, EOM's intact, fundi    benign, both eyes  Ears:    Normal TM's and external ear canals, both ears  Nose:   Nares normal, septum midline, mucosa normal, no drainage    or sinus tenderness  Throat:   Lips, mucosa, and tongue normal; teeth and gums normal  Neck:   Supple, symmetrical, trachea midline, no adenopathy;    thyroid:  no enlargement/tenderness/nodules; no carotid   bruit or JVD  Back:     Symmetric, no curvature, ROM normal, no CVA tenderness  Lungs:     Clear to auscultation bilaterally, respirations unlabored  Chest Wall:    No tenderness or deformity   Heart:    Regular rate and rhythm, S1 and S2 normal, no murmur, rub    or gallop  Breast Exam:    No tenderness, masses, or nipple abnormality  Abdomen:     Soft, non-tender, bowel sounds active all four quadrants,    no masses, no organomegaly  Genitalia:    Normal female without lesion, discharge or tenderness, NSSR, NT, minimal mobility, normal adnexal exam     Extremities:   Extremities normal, atraumatic, no cyanosis or edema  Pulses:   2+ and symmetric all extremities  Skin:   Skin color, texture, turgor normal, no rashes or lesions  Lymph nodes:   Cervical, supraclavicular, and axillary nodes normal  Neurologic:   CNII-XII intact, normal strength, sensation and reflexes    throughout  .    Assessment:    Healthy female exam.    Plan:     Breast self exam technique reviewed and patient encouraged to perform self-exam monthly. Chlamydia specimen. GC specimen. Thin prep Pap smear.

## 2013-04-28 ENCOUNTER — Encounter: Payer: Managed Care, Other (non HMO) | Admitting: Family Medicine

## 2013-04-30 ENCOUNTER — Ambulatory Visit (INDEPENDENT_AMBULATORY_CARE_PROVIDER_SITE_OTHER): Payer: Managed Care, Other (non HMO) | Admitting: Family Medicine

## 2013-04-30 ENCOUNTER — Encounter: Payer: Self-pay | Admitting: Family Medicine

## 2013-04-30 VITALS — BP 107/72 | HR 82 | Temp 97.5°F | Wt 133.0 lb

## 2013-04-30 DIAGNOSIS — Z0000 Encounter for general adult medical examination without abnormal findings: Secondary | ICD-10-CM

## 2013-04-30 DIAGNOSIS — J019 Acute sinusitis, unspecified: Secondary | ICD-10-CM

## 2013-04-30 LAB — COMPLETE METABOLIC PANEL WITH GFR
ALT: 14 U/L (ref 0–35)
AST: 14 U/L (ref 0–37)
Albumin: 4.5 g/dL (ref 3.5–5.2)
Calcium: 9.1 mg/dL (ref 8.4–10.5)
Chloride: 102 mEq/L (ref 96–112)
Potassium: 3.9 mEq/L (ref 3.5–5.3)

## 2013-04-30 LAB — LIPID PANEL
LDL Cholesterol: 84 mg/dL (ref 0–99)
VLDL: 16 mg/dL (ref 0–40)

## 2013-04-30 MED ORDER — AMOXICILLIN-POT CLAVULANATE 875-125 MG PO TABS: 1.0000 | ORAL_TABLET | Freq: Two times a day (BID) | ORAL | Status: DC

## 2013-04-30 NOTE — Patient Instructions (Addendum)
Keep up a regular exercise program and make sure you are eating a healthy diet Try to eat 4 servings of dairy a day, or if you are lactose intolerant take a calcium with vitamin D daily.  Your vaccines are up to date.     Sinusitis Sinusitis is redness, soreness, and swelling (inflammation) of the paranasal sinuses. Paranasal sinuses are air pockets within the bones of your face (beneath the eyes, the middle of the forehead, or above the eyes). In healthy paranasal sinuses, mucus is able to drain out, and air is able to circulate through them by way of your nose. However, when your paranasal sinuses are inflamed, mucus and air can become trapped. This can allow bacteria and other germs to grow and cause infection. Sinusitis can develop quickly and last only a short time (acute) or continue over a long period (chronic). Sinusitis that lasts for more than 12 weeks is considered chronic.  CAUSES  Causes of sinusitis include:  Allergies.  Structural abnormalities, such as displacement of the cartilage that separates your nostrils (deviated septum), which can decrease the air flow through your nose and sinuses and affect sinus drainage.  Functional abnormalities, such as when the small hairs (cilia) that line your sinuses and help remove mucus do not work properly or are not present. SYMPTOMS  Symptoms of acute and chronic sinusitis are the same. The primary symptoms are pain and pressure around the affected sinuses. Other symptoms include:  Upper toothache.  Earache.  Headache.  Bad breath.  Decreased sense of smell and taste.  A cough, which worsens when you are lying flat.  Fatigue.  Fever.  Thick drainage from your nose, which often is green and may contain pus (purulent).  Swelling and warmth over the affected sinuses. DIAGNOSIS  Your caregiver will perform a physical exam. During the exam, your caregiver may:  Look in your nose for signs of abnormal growths in your nostrils  (nasal polyps).  Tap over the affected sinus to check for signs of infection.  View the inside of your sinuses (endoscopy) with a special imaging device with a light attached (endoscope), which is inserted into your sinuses. If your caregiver suspects that you have chronic sinusitis, one or more of the following tests may be recommended:  Allergy tests.  Nasal culture A sample of mucus is taken from your nose and sent to a lab and screened for bacteria.  Nasal cytology A sample of mucus is taken from your nose and examined by your caregiver to determine if your sinusitis is related to an allergy. TREATMENT  Most cases of acute sinusitis are related to a viral infection and will resolve on their own within 10 days. Sometimes medicines are prescribed to help relieve symptoms (pain medicine, decongestants, nasal steroid sprays, or saline sprays).  However, for sinusitis related to a bacterial infection, your caregiver will prescribe antibiotic medicines. These are medicines that will help kill the bacteria causing the infection.  Rarely, sinusitis is caused by a fungal infection. In theses cases, your caregiver will prescribe antifungal medicine. For some cases of chronic sinusitis, surgery is needed. Generally, these are cases in which sinusitis recurs more than 3 times per year, despite other treatments. HOME CARE INSTRUCTIONS   Drink plenty of water. Water helps thin the mucus so your sinuses can drain more easily.  Use a humidifier.  Inhale steam 3 to 4 times a day (for example, sit in the bathroom with the shower running).  Apply a warm, moist  washcloth to your face 3 to 4 times a day, or as directed by your caregiver.  Use saline nasal sprays to help moisten and clean your sinuses.  Take over-the-counter or prescription medicines for pain, discomfort, or fever only as directed by your caregiver. SEEK IMMEDIATE MEDICAL CARE IF:  You have increasing pain or severe headaches.  You  have nausea, vomiting, or drowsiness.  You have swelling around your face.  You have vision problems.  You have a stiff neck.  You have difficulty breathing. MAKE SURE YOU:   Understand these instructions.  Will watch your condition.  Will get help right away if you are not doing well or get worse. Document Released: 08/05/2005 Document Revised: 10/28/2011 Document Reviewed: 08/20/2011 [facility] Patient Information 2014 [place], [place].

## 2013-04-30 NOTE — Progress Notes (Signed)
  Subjective:     Annette Koch is a 22 y.o. female and is here for a comprehensive physical exam. The patient reports problems - nasal congestion x 2 weeks. Has ST and left ear pain.  No fever.  Says her teeth feel sore and cheeks hurt.  No cough. No GI sxs.  No OTC meds.  Did use Tylenol for ear pain.   History   Social History  . Marital Status: Married    Spouse Name: N/A    Number of Children: N/A  . Years of Education: N/A   Occupational History  . Not on file.   Social History Main Topics  . Smoking status: Never Smoker   . Smokeless tobacco: Never Used  . Alcohol Use: Yes     Comment: less often than weekly  . Drug Use: No  . Sexual Activity: Yes    Partners: Male    Birth Control/ Protection: Condom   Other Topics Concern  . Not on file   Social History Narrative  . No narrative on file   Health Maintenance  Topic Date Due  . Influenza Vaccine  03/19/2013  . Pap Smear  03/04/2016  . Tetanus/tdap  08/19/2017    The following portions of the patient's history were reviewed and updated as appropriate: allergies, current medications, past family history, past medical history, past social history, past surgical history and problem list.  Review of Systems A comprehensive review of systems was negative.   Objective:    BP 107/72  Pulse 82  Temp(Src) 97.5 F (36.4 C) (Oral)  Wt 133 lb (60.328 kg)  BMI 22.48 kg/m2 General appearance: alert, cooperative and appears stated age Head: Normocephalic, without obvious abnormality, atraumatic Eyes: conj clear, EOMi, PEERLA Ears: normal TM's and external ear canals both ears Nose: Nares normal. Septum midline. Mucosa normal. No drainage or sinus tenderness. Throat: lips, mucosa, and tongue normal; teeth and gums normal Neck: no adenopathy, no carotid bruit, no JVD, supple, symmetrical, trachea midline and thyroid not enlarged, symmetric, no tenderness/mass/nodules Back: symmetric, no curvature. ROM normal. No CVA  tenderness. Lungs: clear to auscultation bilaterally Heart: regular rate and rhythm, S1, S2 normal, no murmur, click, rub or gallop Abdomen: soft, non-tender; bowel sounds normal; no masses,  no organomegaly Extremities: extremities normal, atraumatic, no cyanosis or edema Pulses: 2+ and symmetric Skin: Skin color, texture, turgor normal. No rashes or lesions Lymph nodes: Cervical adenopathy: nl and Supraclavicular adenopathy: nl Neurologic: Alert and oriented X 3, normal strength and tone. Normal symmetric reflexes. Normal coordination and gait    Assessment:    Healthy female exam.    Plan:     See After Visit Summary for Counseling Recommendations  Keep up a regular exercise program and make sure you are eating a healthy diet Try to eat 4 servings of dairy a day, or if you are lactose intolerant take a calcium with vitamin D daily.  Your vaccines are up to date.    Acute sinusitis - will treat with Augmentin. Can use Tylenol as needed for fever and pain relief. Call if not significantly better in one week. Handout given.  She does have benign-appearing mole on her right upper back that she would like removed. Says it constantly hits her bra strapping gets irritated and sore. She has not had any bleeding or itching with it. She's had a similar mole removed on her lower back. She can schedule this at anytime.

## 2013-05-03 ENCOUNTER — Other Ambulatory Visit: Payer: Self-pay | Admitting: Family Medicine

## 2013-05-03 ENCOUNTER — Telehealth: Payer: Self-pay | Admitting: *Deleted

## 2013-05-03 MED ORDER — AZITHROMYCIN 250 MG PO TABS: ORAL_TABLET | ORAL | Status: DC

## 2013-05-03 NOTE — Telephone Encounter (Signed)
Pt called and stated that she is feeling worse. Dr. Linford Arnold called in a Zpack for her. Pt informed via vm.Annette Koch

## 2013-08-19 NOTE — L&D Delivery Note (Signed)
Delivery Summary for Annette Koch  Labor Events:   Preterm labor:   Rupture date:   Rupture time:   Rupture type: Spontaneous  Fluid Color: Light Meconium  Induction:   Augmentation:   Complications:   Cervical ripening:          Delivery:   Episiotomy:   Lacerations:   Repair suture:   Repair # of packets:   Blood loss (ml):    Information for the patient's newborn:  Amoura, Ransier [[id number]]    Delivery 04/27/2014 1:50 PM by  Vaginal, Spontaneous Delivery Sex:  female Gestational Age: <None> Delivery Clinician:  Kathie Dike Leftwich-Kirby Living?: Yes        APGARS  One minute Five minutes Ten minutes  Skin color: 1   1      Heart rate: 2   2      Grimace: 2   2      Muscle tone: 2   2      Breathing: 2   2      Totals: 9  9      Presentation/position: Vertex  Right Occiput Anterior Resuscitation: None  Cord information: 3 vessels   Disposition of cord blood: No    Blood gases sent? No Complications: None  Placenta: Delivered: 04/27/2014 2:03 PM  Spontaneous  Intact appearance Newborn Measurements: Weight:   Height:   Head circumference:   Chest circumference:   Other providers:  Edrick Oh Arisa Congleton C Jed Kutch  Additional  information: Forceps:   Vacuum:   Breech:   Observed anomalies        Delivery Note Active 2nd stage lasted 2 hours with reassuring FHR throughout. Birth in pool with Fatima Blank CNM assisting. I managed 3rd stage and did repair. At 1:50 PM a viable female was delivered via Vaginal, Spontaneous Delivery (Presentation: Right Occiput Anterior).  APGAR: 9, 9; weight .   Placenta status: Intact, Spontaneous.  Cord: 3 vessels with the following complications: None except tortuous vessel.  Anesthesia: Local  Episiotomy: None Lacerations: 2nd degree perineal and right labium at clitoral hood Suture Repair: 3.0 vicryl rapide 4-0 vicryl Est. Blood Loss (mL): 379ml  Mom to postpartum.  Baby to Couplet care / Skin to  Skin.  Ivet Guerrieri 04/27/2014, 2:45 PM

## 2013-08-19 NOTE — L&D Delivery Note (Signed)
Attestation of Attending Supervision of Advanced Practitioner (PA/CNM/NP): Evaluation and management procedures were performed by the Advanced Practitioner under my supervision and collaboration.  I have reviewed the Advanced Practitioner's note and chart, and I agree with the management and plan.  Aftyn Nott, DO Attending Physician Faculty Practice, [facility]  

## 2013-08-20 ENCOUNTER — Ambulatory Visit (INDEPENDENT_AMBULATORY_CARE_PROVIDER_SITE_OTHER): Payer: Managed Care, Other (non HMO) | Admitting: Family Medicine

## 2013-08-20 ENCOUNTER — Encounter: Payer: Self-pay | Admitting: Family Medicine

## 2013-08-20 VITALS — BP 127/76 | HR 107 | Ht 64.0 in | Wt 129.0 lb

## 2013-08-20 DIAGNOSIS — Z3490 Encounter for supervision of normal pregnancy, unspecified, unspecified trimester: Secondary | ICD-10-CM

## 2013-08-20 DIAGNOSIS — N912 Amenorrhea, unspecified: Secondary | ICD-10-CM

## 2013-08-20 DIAGNOSIS — Z3201 Encounter for pregnancy test, result positive: Secondary | ICD-10-CM

## 2013-08-20 LAB — POCT URINE PREGNANCY: Preg Test, Ur: POSITIVE

## 2013-08-20 MED ORDER — CONCEPT OB 130-92.4-1 MG PO CAPS: 1.0000 | ORAL_CAPSULE | Freq: Every day | ORAL | Status: DC

## 2013-08-20 NOTE — Progress Notes (Signed)
   Subjective:    Patient ID: Annette Koch, female    DOB: [birth year], 23 y.o.   MRN: [id number]  HPI pt has taken 2 home tests and they were positive she reports that she is trying to conceive. She has been trying to get pregnant. She is 3 days late for her current cycle. Previous cycle is normal. LMP 07/23/13.   She has felt well otherwise. She's not had any other nausea fatigue etc. She has very regular cycles.  Review of Systems     Objective:   Physical Exam  Constitutional: She is oriented to person, place, and time. She appears well-developed and well-nourished.  HENT:  Head: Normocephalic and atraumatic.  Eyes: Conjunctivae and EOM are normal.  Cardiovascular: Normal rate.   Pulmonary/Chest: Effort normal.  Neurological: She is alert and oriented to person, place, and time.  Skin: Skin is dry. No pallor.  Psychiatric: She has a normal mood and affect. Her behavior is normal.          Assessment & Plan:  Pregnancy-New Dx.  recommend serum pregnancy for confirmation. Also recommend PNV.  Samples given of concept OB.  EDD 04/29/14.  She is 4 weeks and 0 days. Inserts she. Will refer her to OB/GYN here in the building. She would like to have the baby at [facility] hospital.

## 2013-08-21 LAB — HCG, QUANTITATIVE, PREGNANCY: hCG, Beta Chain, Quant, S: 242.4 m[IU]/mL

## 2013-08-23 ENCOUNTER — Telehealth: Payer: Self-pay

## 2013-08-23 NOTE — Telephone Encounter (Signed)
Can run humidier, use vicks vapor rub.  Avoid delsym products.  Can use honey in hot tea.

## 2013-08-23 NOTE — Telephone Encounter (Signed)
Annette Koch is pregnant. She wants to know if there is something OTC for cough that would be safe.

## 2013-08-23 NOTE — Telephone Encounter (Signed)
Left detailed message.   

## 2013-09-20 ENCOUNTER — Ambulatory Visit (INDEPENDENT_AMBULATORY_CARE_PROVIDER_SITE_OTHER): Payer: Managed Care, Other (non HMO) | Admitting: Advanced Practice Midwife

## 2013-09-20 ENCOUNTER — Encounter: Payer: Self-pay | Admitting: Advanced Practice Midwife

## 2013-09-20 VITALS — BP 115/70 | Wt 130.0 lb

## 2013-09-20 DIAGNOSIS — R8789 Other abnormal findings in specimens from female genital organs: Secondary | ICD-10-CM

## 2013-09-20 DIAGNOSIS — Z3480 Encounter for supervision of other normal pregnancy, unspecified trimester: Secondary | ICD-10-CM

## 2013-09-20 DIAGNOSIS — Z3491 Encounter for supervision of normal pregnancy, unspecified, first trimester: Secondary | ICD-10-CM | POA: Insufficient documentation

## 2013-09-20 LAB — HIV ANTIBODY (ROUTINE TESTING W REFLEX): HIV: NONREACTIVE

## 2013-09-20 NOTE — Progress Notes (Signed)
p-91  Pt is here with husband for NOB intake.  Bedside U/S showed GA of [redacted]w[redacted]d and CRL 18.34mm and FHT 176 BPM

## 2013-09-20 NOTE — Progress Notes (Signed)
Subjective:    Annette Koch is being seen today for her first obstetrical visit.  This is a planned pregnancy. She is at [redacted]w[redacted]d gestation. Her obstetrical history is significant for nothing.. Relationship with FOB: spouse, living together. Patient does intend to breast feed. Pregnancy history fully reviewed. Pt has Hx genital HSV.   Menstrual History: OB History   Grav Para Term Preterm Abortions TAB SAB Ect Mult Living   2    1 1     0       Patient's last menstrual period was 07/23/2013.    The following portions of the patient's history were reviewed and updated as appropriate: allergies, current medications, past family history, past medical history, past social history, past surgical history and problem list.  Last Pap 02/2013 normal, neg HPV.   Review of Systems A comprehensive review of systems was negative. Neg for genital lesions or HSV prodrome.     Objective:    BP 115/70  Wt 130 lb (58.968 kg)  LMP 07/23/2013  General Appearance:    Alert, cooperative, no distress, appears stated age  Head:    Normocephalic, without obvious abnormality, atraumatic  Ears:    Normal TM's and external ear canals, both ears  Nose:   Nares normal, no drainage    or sinus tenderness  Throat:   Lips, mucosa, and tongue normal; teeth and gums normal  Back:     Symmetric, no curvature, ROM normal, no CVA tenderness  Lungs:     Clear to auscultation bilaterally, respirations unlabored  Chest Wall:    No tenderness or deformity   Heart:    Regular rate and rhythm, S1 and S2 normal, no murmur, rub   or gallop  Breast Exam:    No tenderness, masses, or nipple abnormality  Abdomen:     Soft, non-tender, bowel sounds active all four quadrants,    no masses, no organomegaly  Genitalia:    Declined  Extremities:   Extremities normal, atraumatic, no cyanosis or edema  Pulses:   2+ and symmetric all extremities  Skin:   Skin color, texture, turgor normal, no rashes or lesions  Lymph nodes:    Cervical, supraclavicular, and axillary nodes normal  Neurologic:   CNII-XII intact, normal strength, sensation and reflexes    throughout   Pos cardiac activity per informal BS [place].    Assessment:    Pregnancy at 8 and 3/7 weeks    Plan:    Initial labs drawn. Prenatal vitamins. Problem list reviewed and updated. AFP3 discussed: declined. Role of ultrasound in pregnancy discussed; fetal survey: requested. Amniocentesis discussed: not indicated. Follow up in 4 weeks. 75% of 30 min visit spent on counseling and coordination of care.   [place], [place] 09/20/2013 12:43 PM

## 2013-09-20 NOTE — Patient Instructions (Signed)
Pregnancy - First Trimester During sexual intercourse, millions of sperm go into the vagina. Only 1 sperm will penetrate and fertilize the female egg while it is in the Fallopian tube. One week later, the fertilized egg implants into the wall of the uterus. An embryo begins to develop into a baby. At 6 to 8 weeks, the eyes and face are formed and the heartbeat can be seen on ultrasound. At the end of 12 weeks (first trimester), all the baby's organs are formed. Now that you are pregnant, you will want to do everything you can to have a healthy baby. Two of the most important things are to get good prenatal care and follow your caregiver's instructions. Prenatal care is all the medical care you receive before the baby's birth. It is given to prevent, find, and treat problems during the pregnancy and childbirth. PRENATAL EXAMS  During prenatal visits, your weight, blood pressure, and urine are checked. This is done to make sure you are healthy and progressing normally during the pregnancy.  A pregnant woman should gain 25 to 35 pounds during the pregnancy. However, if you are overweight or underweight, your caregiver will advise you regarding your weight.  Your caregiver will ask and answer questions for you.  Blood work, cervical cultures, other necessary tests, and a Pap test are done during your prenatal exams. These tests are done to check on your health and the probable health of your baby. Tests are strongly recommended and done for HIV with your permission. This is the virus that causes AIDS. These tests are done because medicines can be given to help prevent your baby from being born with this infection should you have been infected without knowing it. Blood work is also used to find out your blood type, previous infections, and follow your blood levels (hemoglobin).  Low hemoglobin (anemia) is common during pregnancy. Iron and vitamins are given to help prevent this. Later in the pregnancy, blood  tests for diabetes will be done along with any other tests if any problems develop.  You may need other tests to make sure you and the baby are doing well. CHANGES DURING THE FIRST TRIMESTER  Your body goes through many changes during pregnancy. They vary from person to person. Talk to your caregiver about changes you notice and are concerned about. Changes can include:  Your menstrual period stops.  The egg and sperm carry the genes that determine what you look like. Genes from you and your partner are forming a baby. The female genes determine whether the baby is a boy or a girl.  Your body increases in girth and you may feel bloated.  Feeling sick to your stomach (nauseous) and throwing up (vomiting). If the vomiting is uncontrollable, call your caregiver.  Your breasts will begin to enlarge and become tender.  Your nipples may stick out more and become darker.  The need to urinate more. Painful urination may mean you have a bladder infection.  Tiring easily.  Loss of appetite.  Cravings for certain kinds of food.  At first, you may gain or lose a couple of pounds.  You may have changes in your emotions from day to day (excited to be pregnant or concerned something may go wrong with the pregnancy and baby).  You may have more vivid and strange dreams. HOME CARE INSTRUCTIONS   It is very important to avoid all smoking, alcohol and non-prescribed drugs during your pregnancy. These affect the formation and growth of the baby.   Avoid chemicals while pregnant to ensure the delivery of a healthy infant.  Start your prenatal visits by the 12th week of pregnancy. They are usually scheduled monthly at first, then more often in the last 2 months before delivery. Keep your caregiver's appointments. Follow your caregiver's instructions regarding medicine use, blood and lab tests, exercise, and diet.  During pregnancy, you are providing food for you and your baby. Eat regular, well-balanced  meals. Choose foods such as meat, fish, milk and other low fat dairy products, vegetables, fruits, and whole-grain breads and cereals. Your caregiver will tell you of the ideal weight gain.  You can help morning sickness by keeping soda crackers at the bedside. Eat a couple before arising in the morning. You may want to use the crackers without salt on them.  Eating 4 to 5 small meals rather than 3 large meals a day also may help the nausea and vomiting.  Drinking liquids between meals instead of during meals also seems to help nausea and vomiting.  A physical sexual relationship may be continued throughout pregnancy if there are no other problems. Problems may be early (premature) leaking of amniotic fluid from the membranes, vaginal bleeding, or belly (abdominal) pain.  Exercise regularly if there are no restrictions. Check with your caregiver or physical therapist if you are unsure of the safety of some of your exercises. Greater weight gain will occur in the last 2 trimesters of pregnancy. Exercising will help:  Control your weight.  Keep you in shape.  Prepare you for labor and delivery.  Help you lose your pregnancy weight after you deliver your baby.  Wear a good support or jogging bra for breast tenderness during pregnancy. This may help if worn during sleep too.  Ask when prenatal classes are available. Begin classes when they are offered.  Do not use hot tubs, steam rooms, or saunas.  Wear your seat belt when driving. This protects you and your baby if you are in an accident.  Avoid raw meat, uncooked cheese, cat litter boxes, and soil used by cats throughout the pregnancy. These carry germs that can cause birth defects in the baby.  The first trimester is a good time to visit your dentist for your dental health. Getting your teeth cleaned is okay. Use a softer toothbrush and brush gently during pregnancy.  Ask for help if you have financial, counseling, or nutritional needs  during pregnancy. Your caregiver will be able to offer counseling for these needs as well as refer you for other special needs.  Do not take any medicines or herbs unless told by your caregiver.  Inform your caregiver if there is any mental or physical domestic violence.  Make a list of emergency phone numbers of family, friends, hospital, and police and fire departments.  Write down your questions. Take them to your prenatal visit.  Do not douche.  Do not cross your legs.  If you have to stand for long periods of time, rotate you feet or take small steps in a circle.  You may have more vaginal secretions that may require a sanitary pad. Do not use tampons or scented sanitary pads. MEDICINES AND DRUG USE IN PREGNANCY  Take prenatal vitamins as directed. The vitamin should contain 1 milligram of folic acid. Keep all vitamins out of reach of children. Only a couple vitamins or tablets containing iron may be fatal to a baby or young child when ingested.  Avoid use of all medicines, including herbs, over-the-counter medicines, not   prescribed or suggested by your caregiver. Only take over-the-counter or prescription medicines for pain, discomfort, or fever as directed by your caregiver. Do not use aspirin, ibuprofen, or naproxen unless directed by your caregiver.  Let your caregiver also know about herbs you may be using.  Alcohol is related to a number of birth defects. This includes fetal alcohol syndrome. All alcohol, in any form, should be avoided completely. Smoking will cause low birth rate and premature babies.  Street or illegal drugs are very harmful to the baby. They are absolutely forbidden. A baby born to an addicted mother will be addicted at birth. The baby will go through the same withdrawal an adult does.  Let your caregiver know about any medicines that you have to take and for what reason you take them. SEEK MEDICAL CARE IF:  You have any concerns or worries during your  pregnancy. It is better to call with your questions if you feel they cannot wait, rather than worry about them. SEEK IMMEDIATE MEDICAL CARE IF:   An unexplained oral temperature above 102 F (38.9 C) develops, or as your caregiver suggests.  You have leaking of fluid from the vagina (birth canal). If leaking membranes are suspected, take your temperature and inform your caregiver of this when you call.  There is vaginal spotting or bleeding. Notify your caregiver of the amount and how many pads are used.  You develop a bad smelling vaginal discharge with a change in the color.  You continue to feel sick to your stomach (nauseated) and have no relief from remedies suggested. You vomit blood or coffee ground-like materials.  You lose more than 2 pounds of weight in 1 week.  You gain more than 2 pounds of weight in 1 week and you notice swelling of your face, hands, feet, or legs.  You gain 5 pounds or more in 1 week (even if you do not have swelling of your hands, face, legs, or feet).  You get exposed to [place] measles and have never had them.  You are exposed to fifth disease or chickenpox.  You develop belly (abdominal) pain. Round ligament discomfort is a common non-cancerous (benign) cause of abdominal pain in pregnancy. Your caregiver still must evaluate this.  You develop headache, fever, diarrhea, pain with urination, or shortness of breath.  You fall or are in a car accident or have any kind of trauma.  There is mental or physical violence in your home. Document Released: 07/30/2001 Document Revised: 04/29/2012 Document Reviewed: 01/31/2009 [facility] Patient Information 2014 [place].  Prenatal Care  WHAT IS PRENATAL CARE?  Prenatal care means health care during your pregnancy, before your baby is born. It is very important to take care of yourself and your baby during your pregnancy by:   Getting early prenatal care. If you know you are pregnant, or think you might  be pregnant, call your health care provider as soon as possible. Schedule a visit for a prenatal exam.  Getting regular prenatal care. Follow your health care provider's schedule for blood and other necessary tests. Do not miss appointments.  Doing everything you can to keep yourself and your baby healthy during your pregnancy.  Getting complete care. Prenatal care should include evaluation of the medical, dietary, educational, psychological, and social needs of you and your significant other. The medical and genetic history of your family and the family of your baby's father should be discussed with your health care provider.  Discussing with your health care provider:  Prescription, over-the-counter, and herbal medicines that you take.  Any history of substance abuse, alcohol use, smoking, and illegal drug use.  Any history of domestic abuse and violence.  Immunizations you have received.  Your nutrition and diet.  The amount of exercise you do.  Any environmental and occupational hazards to which you are exposed.  History of sexually transmitted infections for both you and your partner.  Previous pregnancies you have had. WHY IS PRENATAL CARE SO IMPORTANT?  By regularly seeing your health care provider, you help ensure that problems can be identified early so that they can be treated as soon as possible. Other problems might be prevented. Many studies have shown that early and regular prenatal care is important for the health of mothers and their babies.  HOW CAN I TAKE CARE OF MYSELF WHILE I AM PREGNANT?  Here are ways to take care of yourself and your baby:   Start or continue taking your multivitamin with 400 micrograms (mcg) of folic acid every day.  Get early and regular prenatal care. It is very important to see a health care provider during your pregnancy. Your health care provider will check at each visit to make sure that you and the baby are healthy. If there are any  problems, action can be taken right away to help you and the baby.  Eat a healthy diet that includes:  Fruits.  Vegetables.  Foods low in saturated fat.  Whole grains.  Calcium-rich foods, such as milk, yogurt, and hard cheeses.  Drink 6 to 8 glasses of liquids a day.  Unless your health care provider tells you not to, try to be physically active for 30 minutes, most days of the week. If you are pressed for time, you can get your activity in through 10-minute segments, three times a day.  Do not smoke, drink alcohol, or use drugs. These can cause long-term damage to your baby. Talk with your health care provider about steps to take to stop smoking. Talk with a member of your faith community, a counselor, a trusted friend, or your health care provider if you are concerned about your alcohol or drug use.  Ask your health care provider before taking any medicine, even over-the-counter medicines. Some medicines are not safe to take during pregnancy.  Get plenty of rest and sleep.  Avoid hot tubs and saunas during pregnancy.  Do not have X-rays taken unless absolutely necessary and with the recommendation of your health care provider. A lead shield can be placed on your abdomen to protect the baby when X-rays are taken in other parts of the body.  Do not empty the cat litter when you are pregnant. It may contain a parasite that causes an infection called toxoplasmosis, which can cause birth defects. Also, use gloves when working in garden areas used by cats.  Do not eat uncooked or undercooked meats or fish.  Do not eat soft, mold-ripened cheeses (Brie, Camembert, and chevre) or soft, blue-veined cheese (Danish blue and Roquefort).  Stay away from toxic chemicals like:  Insecticides.  Solvents (some cleaners or paint thinners).  Lead.  Mercury.  Sexual intercourse may continue until the end of the pregnancy, unless you have a medical problem or there is a problem with the  pregnancy and your health care provider tells you not to.  Do not wear high-heel shoes, especially during the second half of the pregnancy. You can lose your balance and fall.  Do not take long trips, unless absolutely  necessary. Be sure to see your health care provider before going on the trip.  Do not sit in one position for more than 2 hours when on a trip.  Take a copy of your medical records when going on a trip. Know where a hospital is located in the city you are visiting, in case of an emergency.  Most dangerous household products will have pregnancy warnings on their labels. Ask your health care provider about products if you are unsure.  Limit or eliminate your caffeine intake from coffee, tea, sodas, medicines, and chocolate.  Many women continue working through pregnancy. Staying active might help you stay healthier. If you have a question about the safety or the hours you work at your particular job, talk with your health care provider.  Get informed:  Read books.  Watch videos.  Go to childbirth classes for you and your significant other.  Talk with experienced moms.  Ask your health care provider about childbirth education classes for you and your partner. Classes can help you and your partner prepare for the birth of your baby.  Ask about a baby doctor (pediatrician) and methods and pain medicine for labor, delivery, and possible cesarean delivery. HOW OFTEN SHOULD I SEE MY HEALTH CARE PROVIDER DURING PREGNANCY?  Your health care provider will give you a schedule for your prenatal visits. You will have visits more often as you get closer to the end of your pregnancy. An average pregnancy lasts about 40 weeks.  A typical schedule includes visiting your health care provider:   About once each month during your first 6 months of pregnancy.  Every 2 weeks during the next 2 months.  Weekly in the last month, until the delivery date. Your health care provider will  probably want to see you more often if:  You are older than 35 years.  Your pregnancy is high risk because you have certain health problems or problems with the pregnancy, such as:  Diabetes.  High blood pressure.  The baby is not growing on schedule, according to the dates of the pregnancy. Your health care provider will do special tests to make sure you and the baby are not having any serious problems. WHAT HAPPENS DURING PRENATAL VISITS?   At your first prenatal visit, your health care provider will do a physical exam and talk to you about your health history and the health history of your partner and your family. Your health care provider will be able to tell you what date to expect your baby to be born on.  Your first physical exam will include checks of your blood pressure, measurements of your height and weight, and an exam of your pelvic organs. Your health care provider will do a Pap test if you have not had one recently and will do cultures of your cervix to make sure there is no infection.  At each prenatal visit, there will be tests of your blood, urine, blood pressure, weight, and checking the progress of the baby.  At your later prenatal visits, your health care provider will check how you are doing and how the baby is developing. You may have a number of tests done as your pregnancy progresses.  Ultrasound exams are often used to check on the baby's growth and health.  You may have more urine and blood tests, as well as special tests, if needed. These may include amniocentesis to examine fluid in the pregnancy sac, stress tests to check how the baby responds to  contractions, or a biophysical profile to measure fetus well-being. Your health care provider will explain the tests and why they are necessary.  You should discuss with your health care provider your plans to breastfeed or bottle-feed your baby.  Each visit is also a chance for you to learn about staying healthy  during pregnancy and to ask questions. Document Released: 08/08/2003 Document Revised: 05/26/2013 Document Reviewed: 01/21/2013 [facility] Patient Information 2014 [place], [place].

## 2013-09-21 LAB — OBSTETRIC PANEL
ANTIBODY SCREEN: NEGATIVE
BASOS PCT: 0 % (ref 0–1)
Basophils Absolute: 0 10*3/uL (ref 0.0–0.1)
Eosinophils Absolute: 0.1 10*3/uL (ref 0.0–0.7)
Eosinophils Relative: 1 % (ref 0–5)
HCT: 38.8 % (ref 36.0–46.0)
HEP B S AG: NEGATIVE
Hemoglobin: 13.5 g/dL (ref 12.0–15.0)
LYMPHS PCT: 20 % (ref 12–46)
Lymphs Abs: 2 10*3/uL (ref 0.7–4.0)
MCH: 29.2 pg (ref 26.0–34.0)
MCHC: 34.8 g/dL (ref 30.0–36.0)
MCV: 84 fL (ref 78.0–100.0)
MONO ABS: 0.8 10*3/uL (ref 0.1–1.0)
Monocytes Relative: 8 % (ref 3–12)
NEUTROS PCT: 71 % (ref 43–77)
Neutro Abs: 7.5 10*3/uL (ref 1.7–7.7)
PLATELETS: 309 10*3/uL (ref 150–400)
RBC: 4.62 MIL/uL (ref 3.87–5.11)
RDW: 13.8 % (ref 11.5–15.5)
RUBELLA: 1.43 {index} — AB (ref <0.90)
Rh Type: POSITIVE
WBC: 10.5 10*3/uL (ref 4.0–10.5)

## 2013-09-21 LAB — GC/CHLAMYDIA PROBE AMP
CT Probe RNA: NEGATIVE
GC Probe RNA: NEGATIVE

## 2013-09-22 LAB — CULTURE, OB URINE
Colony Count: NO GROWTH
ORGANISM ID, BACTERIA: NO GROWTH

## 2013-10-18 ENCOUNTER — Ambulatory Visit (INDEPENDENT_AMBULATORY_CARE_PROVIDER_SITE_OTHER): Payer: Managed Care, Other (non HMO) | Admitting: Advanced Practice Midwife

## 2013-10-18 ENCOUNTER — Encounter: Payer: Self-pay | Admitting: Advanced Practice Midwife

## 2013-10-18 VITALS — BP 110/72 | Wt 130.0 lb

## 2013-10-18 DIAGNOSIS — O98519 Other viral diseases complicating pregnancy, unspecified trimester: Principal | ICD-10-CM

## 2013-10-18 DIAGNOSIS — Z3480 Encounter for supervision of other normal pregnancy, unspecified trimester: Secondary | ICD-10-CM

## 2013-10-18 DIAGNOSIS — B009 Herpesviral infection, unspecified: Secondary | ICD-10-CM

## 2013-10-18 DIAGNOSIS — IMO0002 Reserved for concepts with insufficient information to code with codable children: Secondary | ICD-10-CM

## 2013-10-18 NOTE — Progress Notes (Signed)
Reviewed labs. Considering waterbirth. Scant post-coital spotting > 1 week ago. A pos. No other bleeding. Good FHR. Too early to check placentation. [place] precautions.

## 2013-10-18 NOTE — Progress Notes (Signed)
[id number] 

## 2013-10-18 NOTE — Patient Instructions (Addendum)
[facility]  [email or web address]  Coralee Rud)  Second Trimester of Pregnancy The second trimester is from week 13 through week 28, months 4 through 6. The second trimester is often a time when you feel your best. Your body has also adjusted to being pregnant, and you begin to feel better physically. Usually, morning sickness has lessened or quit completely, you may have more energy, and you may have an increase in appetite. The second trimester is also a time when the fetus is growing rapidly. At the end of the sixth month, the fetus is about 9 inches long and weighs about 1 pounds. You will likely begin to feel the baby move (quickening) between 18 and 20 weeks of the pregnancy. BODY CHANGES Your body goes through many changes during pregnancy. The changes vary from woman to woman.   Your weight will continue to increase. You will notice your lower abdomen bulging out.  You may begin to get stretch marks on your hips, abdomen, and breasts.  You may develop headaches that can be relieved by medicines approved by your caregiver.  You may urinate more often because the fetus is pressing on your bladder.  You may develop or continue to have heartburn as a result of your pregnancy.  You may develop constipation because certain hormones are causing the muscles that push waste through your intestines to slow down.  You may develop hemorrhoids or swollen, bulging veins (varicose veins).  You may have back pain because of the weight gain and pregnancy hormones relaxing your joints between the bones in your pelvis and as a result of a shift in weight and the muscles that support your balance.  Your breasts will continue to grow and be tender.  Your gums may bleed and may be sensitive to brushing and flossing.  Dark spots or blotches (chloasma, mask of pregnancy) may develop on your face. This will likely fade after the baby is born.  A dark line from your belly  button to the pubic area (linea nigra) may appear. This will likely fade after the baby is born. WHAT TO EXPECT AT YOUR PRENATAL VISITS During a routine prenatal visit:  You will be weighed to make sure you and the fetus are growing normally.  Your blood pressure will be taken.  Your abdomen will be measured to track your baby's growth.  The fetal heartbeat will be listened to.  Any test results from the previous visit will be discussed. Your caregiver may ask you:  How you are feeling.  If you are feeling the baby move.  If you have had any abnormal symptoms, such as leaking fluid, bleeding, severe headaches, or abdominal cramping.  If you have any questions. Other tests that may be performed during your second trimester include:  Blood tests that check for:  Low iron levels (anemia).  Gestational diabetes (between 24 and 28 weeks).  Rh antibodies.  Urine tests to check for infections, diabetes, or protein in the urine.  An ultrasound to confirm the proper growth and development of the baby.  An amniocentesis to check for possible genetic problems.  Fetal screens for spina bifida and Down syndrome. HOME CARE INSTRUCTIONS   Avoid all smoking, herbs, alcohol, and unprescribed drugs. These chemicals affect the formation and growth of the baby.  Follow your caregiver's instructions regarding medicine use. There are medicines that are either safe or unsafe to take during pregnancy.  Exercise only as directed by your caregiver. Experiencing uterine cramps is a good sign  to stop exercising.  Continue to eat regular, healthy meals.  Wear a good support bra for breast tenderness.  Do not use hot tubs, steam rooms, or saunas.  Wear your seat belt at all times when driving.  Avoid raw meat, uncooked cheese, cat litter boxes, and soil used by cats. These carry germs that can cause birth defects in the baby.  Take your prenatal vitamins.  Try taking a stool softener (if  your caregiver approves) if you develop constipation. Eat more high-fiber foods, such as fresh vegetables or fruit and whole grains. Drink plenty of fluids to keep your urine clear or pale yellow.  Take warm sitz baths to soothe any pain or discomfort caused by hemorrhoids. Use hemorrhoid cream if your caregiver approves.  If you develop varicose veins, wear support hose. Elevate your feet for 15 minutes, 3 4 times a day. Limit salt in your diet.  Avoid heavy lifting, wear low heel shoes, and practice good posture.  Rest with your legs elevated if you have leg cramps or low back pain.  Visit your dentist if you have not gone yet during your pregnancy. Use a soft toothbrush to brush your teeth and be gentle when you floss.  A sexual relationship may be continued unless your caregiver directs you otherwise.  Continue to go to all your prenatal visits as directed by your caregiver. SEEK MEDICAL CARE IF:   You have dizziness.  You have mild pelvic cramps, pelvic pressure, or nagging pain in the abdominal area.  You have persistent nausea, vomiting, or diarrhea.  You have a bad smelling vaginal discharge.  You have pain with urination. SEEK IMMEDIATE MEDICAL CARE IF:   You have a fever.  You are leaking fluid from your vagina.  You have spotting or bleeding from your vagina.  You have severe abdominal cramping or pain.  You have rapid weight gain or loss.  You have shortness of breath with chest pain.  You notice sudden or extreme swelling of your face, hands, ankles, feet, or legs.  You have not felt your baby move in over an hour.  You have severe headaches that do not go away with medicine.  You have vision changes. Document Released: 07/30/2001 Document Revised: 04/07/2013 Document Reviewed: 10/06/2012 [facility] Patient Information 2014 [place].

## 2013-11-15 ENCOUNTER — Ambulatory Visit (INDEPENDENT_AMBULATORY_CARE_PROVIDER_SITE_OTHER): Payer: Managed Care, Other (non HMO) | Admitting: Advanced Practice Midwife

## 2013-11-15 VITALS — BP 109/71 | Wt 131.0 lb

## 2013-11-15 DIAGNOSIS — Z3480 Encounter for supervision of other normal pregnancy, unspecified trimester: Secondary | ICD-10-CM

## 2013-11-15 DIAGNOSIS — Z3492 Encounter for supervision of normal pregnancy, unspecified, second trimester: Secondary | ICD-10-CM

## 2013-11-15 NOTE — Progress Notes (Signed)
[id number]

## 2013-11-15 NOTE — Progress Notes (Signed)
Doing well.  Denies vaginal bleeding, LOF, cramping/contractions.  Registered for waterbirth class and childbirth classes.

## 2013-11-15 NOTE — Progress Notes (Signed)
P=[id number] 

## 2013-11-18 ENCOUNTER — Ambulatory Visit: Payer: Managed Care, Other (non HMO) | Admitting: Family Medicine

## 2013-12-03 ENCOUNTER — Other Ambulatory Visit: Payer: Self-pay | Admitting: Advanced Practice Midwife

## 2013-12-03 ENCOUNTER — Ambulatory Visit (HOSPITAL_COMMUNITY)
Admission: RE | Admit: 2013-12-03 | Discharge: 2013-12-03 | Disposition: A | Discharge status: Home / Self Care | Payer: Managed Care, Other (non HMO) | Source: Ambulatory Visit | Attending: Advanced Practice Midwife | Admitting: Advanced Practice Midwife

## 2013-12-03 DIAGNOSIS — Z3689 Encounter for other specified antenatal screening: Secondary | ICD-10-CM | POA: Insufficient documentation

## 2013-12-03 DIAGNOSIS — Z3492 Encounter for supervision of normal pregnancy, unspecified, second trimester: Secondary | ICD-10-CM

## 2013-12-03 DIAGNOSIS — Z3491 Encounter for supervision of normal pregnancy, unspecified, first trimester: Secondary | ICD-10-CM

## 2013-12-13 ENCOUNTER — Ambulatory Visit (INDEPENDENT_AMBULATORY_CARE_PROVIDER_SITE_OTHER): Payer: Managed Care, Other (non HMO) | Admitting: Advanced Practice Midwife

## 2013-12-13 VITALS — BP 129/71 | HR 99 | Wt 138.0 lb

## 2013-12-13 DIAGNOSIS — Z3480 Encounter for supervision of other normal pregnancy, unspecified trimester: Secondary | ICD-10-CM

## 2013-12-13 DIAGNOSIS — Z3492 Encounter for supervision of normal pregnancy, unspecified, second trimester: Secondary | ICD-10-CM

## 2013-12-13 NOTE — Progress Notes (Signed)
Doing well.  Feeling daily fetal movement, denies vaginal bleeding, LOF, cramping/contractions.  Enrolled in waterbirth class and prenatal classes with doula who is providing her tub.

## 2014-01-14 ENCOUNTER — Ambulatory Visit (INDEPENDENT_AMBULATORY_CARE_PROVIDER_SITE_OTHER): Payer: Managed Care, Other (non HMO) | Admitting: Obstetrics & Gynecology

## 2014-01-14 VITALS — BP 125/84 | HR 115 | Wt 144.0 lb

## 2014-01-14 DIAGNOSIS — Z3491 Encounter for supervision of normal pregnancy, unspecified, first trimester: Secondary | ICD-10-CM

## 2014-01-14 DIAGNOSIS — Z3480 Encounter for supervision of other normal pregnancy, unspecified trimester: Secondary | ICD-10-CM

## 2014-01-14 NOTE — Progress Notes (Signed)
Routine visit. Good FM. No problems. Glucola at next visit.

## 2014-01-28 ENCOUNTER — Telehealth: Payer: Self-pay | Admitting: *Deleted

## 2014-01-28 NOTE — Telephone Encounter (Signed)
Pt called in states she has ran fever of 101.2 with sinus congestion and pain, bilateral ear pain. Pt has tried Tylenol for fever but returns once its time to take Tylenol again. Tea Tree essential oil has helped with ear pain but still painful. Pt does not have any pregnancy related complaints as in vaginal bleeding, contractions, etc. Spoke to midwife in office who suggested pt to be evaluated at [facility]. Advised pt to report to [facility] for eval. Pt expressed understanding.

## 2014-02-04 ENCOUNTER — Ambulatory Visit (INDEPENDENT_AMBULATORY_CARE_PROVIDER_SITE_OTHER): Payer: Managed Care, Other (non HMO) | Admitting: Advanced Practice Midwife

## 2014-02-04 ENCOUNTER — Encounter: Payer: Self-pay | Admitting: Advanced Practice Midwife

## 2014-02-04 VITALS — BP 107/68 | HR 85 | Wt 145.0 lb

## 2014-02-04 DIAGNOSIS — Z3483 Encounter for supervision of other normal pregnancy, third trimester: Secondary | ICD-10-CM

## 2014-02-04 DIAGNOSIS — Z3480 Encounter for supervision of other normal pregnancy, unspecified trimester: Secondary | ICD-10-CM

## 2014-02-04 LAB — CBC
HCT: 34.6 % — ABNORMAL LOW (ref 36.0–46.0)
Hemoglobin: 11.6 g/dL — ABNORMAL LOW (ref 12.0–15.0)
MCH: 29.7 pg (ref 26.0–34.0)
MCHC: 33.5 g/dL (ref 30.0–36.0)
MCV: 88.7 fL (ref 78.0–100.0)
Platelets: 290 10*3/uL (ref 150–400)
RBC: 3.9 MIL/uL (ref 3.87–5.11)
RDW: 14.3 % (ref 11.5–15.5)
WBC: 10.6 10*3/uL — ABNORMAL HIGH (ref 4.0–10.5)

## 2014-02-04 NOTE — Progress Notes (Signed)
28 week labs today.  

## 2014-02-04 NOTE — Progress Notes (Signed)
Glucola today. Feels well. Concerned about whether CNM will be available for waterbirth. Heard a story when a CNM was not available and wants to make sure.  Discussed issues, and that we make every attempt to have a CNM available.

## 2014-02-04 NOTE — Patient Instructions (Signed)
Third Trimester of Pregnancy The third trimester is from week 29 through week 42, months 7 through 9. The third trimester is a time when the fetus is growing rapidly. At the end of the ninth month, the fetus is about 20 inches in length and weighs 6-10 pounds.  BODY CHANGES Your body goes through many changes during pregnancy. The changes vary from woman to woman.   Your weight will continue to increase. You can expect to gain 25-35 pounds (11-16 kg) by the end of the pregnancy.  You may begin to get stretch marks on your hips, abdomen, and breasts.  You may urinate more often because the fetus is moving lower into your pelvis and pressing on your bladder.  You may develop or continue to have heartburn as a result of your pregnancy.  You may develop constipation because certain hormones are causing the muscles that push waste through your intestines to slow down.  You may develop hemorrhoids or swollen, bulging veins (varicose veins).  You may have pelvic pain because of the weight gain and pregnancy hormones relaxing your joints between the bones in your pelvis. Backaches may result from overexertion of the muscles supporting your posture.  You may have changes in your hair. These can include thickening of your hair, rapid growth, and changes in texture. Some women also have hair loss during or after pregnancy, or hair that feels dry or thin. Your hair will most likely return to normal after your baby is born.  Your breasts will continue to grow and be tender. A yellow discharge may leak from your breasts called colostrum.  Your belly button may stick out.  You may feel short of breath because of your expanding uterus.  You may notice the fetus "dropping," or moving lower in your abdomen.  You may have a bloody mucus discharge. This usually occurs a few days to a week before labor begins.  Your cervix becomes thin and soft (effaced) near your due date. WHAT TO EXPECT AT YOUR PRENATAL  EXAMS  You will have prenatal exams every 2 weeks until week 36. Then, you will have weekly prenatal exams. During a routine prenatal visit:  You will be weighed to make sure you and the fetus are growing normally.  Your blood pressure is taken.  Your abdomen will be measured to track your baby's growth.  The fetal heartbeat will be listened to.  Any test results from the previous visit will be discussed.  You may have a cervical check near your due date to see if you have effaced. At around 36 weeks, your caregiver will check your cervix. At the same time, your caregiver will also perform a test on the secretions of the vaginal tissue. This test is to determine if a type of bacteria, Group B streptococcus, is present. Your caregiver will explain this further. Your caregiver may ask you:  What your birth plan is.  How you are feeling.  If you are feeling the baby move.  If you have had any abnormal symptoms, such as leaking fluid, bleeding, severe headaches, or abdominal cramping.  If you have any questions. Other tests or screenings that may be performed during your third trimester include:  Blood tests that check for low iron levels (anemia).  Fetal testing to check the health, activity level, and growth of the fetus. Testing is done if you have certain medical conditions or if there are problems during the pregnancy. FALSE LABOR You may feel small, irregular contractions that   eventually go away. These are called Braxton Hicks contractions, or false labor. Contractions may last for hours, days, or even weeks before true labor sets in. If contractions come at regular intervals, intensify, or become painful, it is best to be seen by your caregiver.  SIGNS OF LABOR   Menstrual-like cramps.  Contractions that are 5 minutes apart or less.  Contractions that start on the top of the uterus and spread down to the lower abdomen and back.  A sense of increased pelvic pressure or back  pain.  A watery or bloody mucus discharge that comes from the vagina. If you have any of these signs before the 37th week of pregnancy, call your caregiver right away. You need to go to the hospital to get checked immediately. HOME CARE INSTRUCTIONS   Avoid all smoking, herbs, alcohol, and unprescribed drugs. These chemicals affect the formation and growth of the baby.  Follow your caregiver's instructions regarding medicine use. There are medicines that are either safe or unsafe to take during pregnancy.  Exercise only as directed by your caregiver. Experiencing uterine cramps is a good sign to stop exercising.  Continue to eat regular, healthy meals.  Wear a good support bra for breast tenderness.  Do not use hot tubs, steam rooms, or saunas.  Wear your seat belt at all times when driving.  Avoid raw meat, uncooked cheese, cat litter boxes, and soil used by cats. These carry germs that can cause birth defects in the baby.  Take your prenatal vitamins.  Try taking a stool softener (if your caregiver approves) if you develop constipation. Eat more high-fiber foods, such as fresh vegetables or fruit and whole grains. Drink plenty of fluids to keep your urine clear or pale yellow.  Take warm sitz baths to soothe any pain or discomfort caused by hemorrhoids. Use hemorrhoid cream if your caregiver approves.  If you develop varicose veins, wear support hose. Elevate your feet for 15 minutes, 3-4 times a day. Limit salt in your diet.  Avoid heavy lifting, wear low heal shoes, and practice good posture.  Rest a lot with your legs elevated if you have leg cramps or low back pain.  Visit your dentist if you have not gone during your pregnancy. Use a soft toothbrush to brush your teeth and be gentle when you floss.  A sexual relationship may be continued unless your caregiver directs you otherwise.  Do not travel far distances unless it is absolutely necessary and only with the approval  of your caregiver.  Take prenatal classes to understand, practice, and ask questions about the labor and delivery.  Make a trial run to the hospital.  Pack your hospital bag.  Prepare the baby's nursery.  Continue to go to all your prenatal visits as directed by your caregiver. SEEK MEDICAL CARE IF:  You are unsure if you are in labor or if your water has broken.  You have dizziness.  You have mild pelvic cramps, pelvic pressure, or nagging pain in your abdominal area.  You have persistent nausea, vomiting, or diarrhea.  You have a bad smelling vaginal discharge.  You have pain with urination. SEEK IMMEDIATE MEDICAL CARE IF:   You have a fever.  You are leaking fluid from your vagina.  You have spotting or bleeding from your vagina.  You have severe abdominal cramping or pain.  You have rapid weight loss or gain.  You have shortness of breath with chest pain.  You notice sudden or extreme swelling   of your face, hands, ankles, feet, or legs.  You have not felt your baby move in over an hour.  You have severe headaches that do not go away with medicine.  You have vision changes. Document Released: 07/30/2001 Document Revised: 08/10/2013 Document Reviewed: 10/06/2012 [employer] Patient Information 2015 [employer]. This information is not intended to replace advice given to you by your health care Haaris Metallo. Make sure you discuss any questions you have with your health care Alondra Vandeven.  

## 2014-02-05 LAB — RPR

## 2014-02-05 LAB — HIV ANTIBODY (ROUTINE TESTING W REFLEX): HIV 1&2 Ab, 4th Generation: NONREACTIVE

## 2014-02-05 LAB — GLUCOSE TOLERANCE, 1 HOUR (50G) W/O FASTING: Glucose, 1 Hour GTT: 90 mg/dL (ref 70–140)

## 2014-02-07 ENCOUNTER — Telehealth: Payer: Self-pay | Admitting: *Deleted

## 2014-02-07 NOTE — Telephone Encounter (Signed)
LM on voicemail of normal 1 hr GTT. 

## 2014-02-08 ENCOUNTER — Encounter (HOSPITAL_COMMUNITY): Payer: Self-pay | Admitting: Advanced Practice Midwife

## 2014-02-17 ENCOUNTER — Encounter: Payer: Managed Care, Other (non HMO) | Admitting: Obstetrics & Gynecology

## 2014-02-22 ENCOUNTER — Ambulatory Visit (INDEPENDENT_AMBULATORY_CARE_PROVIDER_SITE_OTHER): Payer: Managed Care, Other (non HMO) | Admitting: Obstetrics & Gynecology

## 2014-02-22 ENCOUNTER — Encounter: Payer: Self-pay | Admitting: Obstetrics & Gynecology

## 2014-02-22 VITALS — BP 128/73 | HR 116 | Wt 156.0 lb

## 2014-02-22 DIAGNOSIS — Z3480 Encounter for supervision of other normal pregnancy, unspecified trimester: Secondary | ICD-10-CM

## 2014-02-22 DIAGNOSIS — Z3491 Encounter for supervision of normal pregnancy, unspecified, first trimester: Secondary | ICD-10-CM

## 2014-02-22 NOTE — Progress Notes (Signed)
Routine visit. Good FM. No problems.  

## 2014-03-14 ENCOUNTER — Encounter: Payer: Self-pay | Admitting: *Deleted

## 2014-03-14 ENCOUNTER — Ambulatory Visit (INDEPENDENT_AMBULATORY_CARE_PROVIDER_SITE_OTHER): Payer: Managed Care, Other (non HMO) | Admitting: Advanced Practice Midwife

## 2014-03-14 VITALS — BP 121/75 | HR 92 | Wt 156.0 lb

## 2014-03-14 DIAGNOSIS — Z3483 Encounter for supervision of other normal pregnancy, third trimester: Secondary | ICD-10-CM

## 2014-03-14 DIAGNOSIS — Z3480 Encounter for supervision of other normal pregnancy, unspecified trimester: Secondary | ICD-10-CM

## 2014-03-14 NOTE — Patient Instructions (Signed)
Third Trimester of Pregnancy The third trimester is from week 29 through week 42, months 7 through 9. The third trimester is a time when the fetus is growing rapidly. At the end of the ninth month, the fetus is about 20 inches in length and weighs 6-10 pounds.  BODY CHANGES Your body goes through many changes during pregnancy. The changes vary from woman to woman.   Your weight will continue to increase. You can expect to gain 25-35 pounds (11-16 kg) by the end of the pregnancy.  You may begin to get stretch marks on your hips, abdomen, and breasts.  You may urinate more often because the fetus is moving lower into your pelvis and pressing on your bladder.  You may develop or continue to have heartburn as a result of your pregnancy.  You may develop constipation because certain hormones are causing the muscles that push waste through your intestines to slow down.  You may develop hemorrhoids or swollen, bulging veins (varicose veins).  You may have pelvic pain because of the weight gain and pregnancy hormones relaxing your joints between the bones in your pelvis. Backaches may result from overexertion of the muscles supporting your posture.  You may have changes in your hair. These can include thickening of your hair, rapid growth, and changes in texture. Some women also have hair loss during or after pregnancy, or hair that feels dry or thin. Your hair will most likely return to normal after your baby is born.  Your breasts will continue to grow and be tender. A yellow discharge may leak from your breasts called colostrum.  Your belly button may stick out.  You may feel short of breath because of your expanding uterus.  You may notice the fetus "dropping," or moving lower in your abdomen.  You may have a bloody mucus discharge. This usually occurs a few days to a week before labor begins.  Your cervix becomes thin and soft (effaced) near your due date. WHAT TO EXPECT AT YOUR PRENATAL  EXAMS  You will have prenatal exams every 2 weeks until week 36. Then, you will have weekly prenatal exams. During a routine prenatal visit:  You will be weighed to make sure you and the fetus are growing normally.  Your blood pressure is taken.  Your abdomen will be measured to track your baby's growth.  The fetal heartbeat will be listened to.  Any test results from the previous visit will be discussed.  You may have a cervical check near your due date to see if you have effaced. At around 36 weeks, your caregiver will check your cervix. At the same time, your caregiver will also perform a test on the secretions of the vaginal tissue. This test is to determine if a type of bacteria, Group B streptococcus, is present. Your caregiver will explain this further. Your caregiver may ask you:  What your birth plan is.  How you are feeling.  If you are feeling the baby move.  If you have had any abnormal symptoms, such as leaking fluid, bleeding, severe headaches, or abdominal cramping.  If you have any questions. Other tests or screenings that may be performed during your third trimester include:  Blood tests that check for low iron levels (anemia).  Fetal testing to check the health, activity level, and growth of the fetus. Testing is done if you have certain medical conditions or if there are problems during the pregnancy. FALSE LABOR You may feel small, irregular contractions that   eventually go away. These are called Braxton Hicks contractions, or false labor. Contractions may last for hours, days, or even weeks before true labor sets in. If contractions come at regular intervals, intensify, or become painful, it is best to be seen by your caregiver.  SIGNS OF LABOR   Menstrual-like cramps.  Contractions that are 5 minutes apart or less.  Contractions that start on the top of the uterus and spread down to the lower abdomen and back.  A sense of increased pelvic pressure or back  pain.  A watery or bloody mucus discharge that comes from the vagina. If you have any of these signs before the 37th week of pregnancy, call your caregiver right away. You need to go to the hospital to get checked immediately. HOME CARE INSTRUCTIONS   Avoid all smoking, herbs, alcohol, and unprescribed drugs. These chemicals affect the formation and growth of the baby.  Follow your caregiver's instructions regarding medicine use. There are medicines that are either safe or unsafe to take during pregnancy.  Exercise only as directed by your caregiver. Experiencing uterine cramps is a good sign to stop exercising.  Continue to eat regular, healthy meals.  Wear a good support bra for breast tenderness.  Do not use hot tubs, steam rooms, or saunas.  Wear your seat belt at all times when driving.  Avoid raw meat, uncooked cheese, cat litter boxes, and soil used by cats. These carry germs that can cause birth defects in the baby.  Take your prenatal vitamins.  Try taking a stool softener (if your caregiver approves) if you develop constipation. Eat more high-fiber foods, such as fresh vegetables or fruit and whole grains. Drink plenty of fluids to keep your urine clear or pale yellow.  Take warm sitz baths to soothe any pain or discomfort caused by hemorrhoids. Use hemorrhoid cream if your caregiver approves.  If you develop varicose veins, wear support hose. Elevate your feet for 15 minutes, 3-4 times a day. Limit salt in your diet.  Avoid heavy lifting, wear low heal shoes, and practice good posture.  Rest a lot with your legs elevated if you have leg cramps or low back pain.  Visit your dentist if you have not gone during your pregnancy. Use a soft toothbrush to brush your teeth and be gentle when you floss.  A sexual relationship may be continued unless your caregiver directs you otherwise.  Do not travel far distances unless it is absolutely necessary and only with the approval  of your caregiver.  Take prenatal classes to understand, practice, and ask questions about the labor and delivery.  Make a trial run to the hospital.  Pack your hospital bag.  Prepare the baby's nursery.  Continue to go to all your prenatal visits as directed by your caregiver. SEEK MEDICAL CARE IF:  You are unsure if you are in labor or if your water has broken.  You have dizziness.  You have mild pelvic cramps, pelvic pressure, or nagging pain in your abdominal area.  You have persistent nausea, vomiting, or diarrhea.  You have a bad smelling vaginal discharge.  You have pain with urination. SEEK IMMEDIATE MEDICAL CARE IF:   You have a fever.  You are leaking fluid from your vagina.  You have spotting or bleeding from your vagina.  You have severe abdominal cramping or pain.  You have rapid weight loss or gain.  You have shortness of breath with chest pain.  You notice sudden or extreme swelling   of your face, hands, ankles, feet, or legs.  You have not felt your baby move in over an hour.  You have severe headaches that do not go away with medicine.  You have vision changes. Document Released: 07/30/2001 Document Revised: 08/10/2013 Document Reviewed: 10/06/2012 [employer] Patient Information 2015 [employer]. This information is not intended to replace advice given to you by your health care provider. Make sure you discuss any questions you have with your health care provider.  

## 2014-03-14 NOTE — Progress Notes (Signed)
Brought birth plan today, will scan.  Wants noninterventive birth. Reviewed safety issues and reasons we do cervix checks, etc, but will try to limit them. Using [employer] and a doula.

## 2014-03-15 ENCOUNTER — Encounter: Payer: Self-pay | Admitting: *Deleted

## 2014-03-28 ENCOUNTER — Ambulatory Visit (INDEPENDENT_AMBULATORY_CARE_PROVIDER_SITE_OTHER): Payer: Managed Care, Other (non HMO) | Admitting: Advanced Practice Midwife

## 2014-03-28 VITALS — BP 114/68 | HR 93 | Wt 159.0 lb

## 2014-03-28 DIAGNOSIS — Z3491 Encounter for supervision of normal pregnancy, unspecified, first trimester: Secondary | ICD-10-CM

## 2014-03-28 DIAGNOSIS — O98313 Other infections with a predominantly sexual mode of transmission complicating pregnancy, third trimester: Secondary | ICD-10-CM

## 2014-03-28 DIAGNOSIS — A6000 Herpesviral infection of urogenital system, unspecified: Secondary | ICD-10-CM

## 2014-03-28 DIAGNOSIS — Z3480 Encounter for supervision of other normal pregnancy, unspecified trimester: Secondary | ICD-10-CM

## 2014-03-28 DIAGNOSIS — O98519 Other viral diseases complicating pregnancy, unspecified trimester: Secondary | ICD-10-CM

## 2014-03-28 DIAGNOSIS — A6009 Herpesviral infection of other urogenital tract: Secondary | ICD-10-CM

## 2014-03-28 MED ORDER — VALACYCLOVIR HCL 500 MG PO TABS: 500.0000 mg | ORAL_TABLET | Freq: Two times a day (BID) | ORAL | Status: DC

## 2014-03-28 NOTE — Progress Notes (Signed)
Declined TDaP. Active baby.

## 2014-03-28 NOTE — Patient Instructions (Signed)
Braxton Hicks Contractions Contractions of the uterus can occur throughout pregnancy. Contractions are not always a sign that you are in labor.  WHAT ARE BRAXTON HICKS CONTRACTIONS?  Contractions that occur before labor are called Braxton Hicks contractions, or false labor. Toward the end of pregnancy (32-34 weeks), these contractions can develop more often and may become more forceful. This is not true labor because these contractions do not result in opening (dilatation) and thinning of the cervix. They are sometimes difficult to tell apart from true labor because these contractions can be forceful and people have different pain tolerances. You should not feel embarrassed if you go to the hospital with false labor. Sometimes, the only way to tell if you are in true labor is for your health care provider to look for changes in the cervix. If there are no prenatal problems or other health problems associated with the pregnancy, it is completely safe to be sent home with false labor and await the onset of true labor. HOW CAN YOU TELL THE DIFFERENCE BETWEEN TRUE AND FALSE LABOR? False Labor  The contractions of false labor are usually shorter and not as hard as those of true labor.   The contractions are usually irregular.   The contractions are often felt in the front of the lower abdomen and in the groin.   The contractions may go away when you walk around or change positions while lying down.   The contractions get weaker and are shorter lasting as time goes on.   The contractions do not usually become progressively stronger, regular, and closer together as with true labor.  True Labor  Contractions in true labor last 30-70 seconds, become very regular, usually become more intense, and increase in frequency.   The contractions do not go away with walking.   The discomfort is usually felt in the top of the uterus and spreads to the lower abdomen and low back.   True labor can be  determined by your health care provider with an exam. This will show that the cervix is dilating and getting thinner.  WHAT TO REMEMBER  Keep up with your usual exercises and follow other instructions given by your health care provider.   Take medicines as directed by your health care provider.   Keep your regular prenatal appointments.   Eat and drink lightly if you think you are going into labor.   If Braxton Hicks contractions are making you uncomfortable:   Change your position from lying down or resting to walking, or from walking to resting.   Sit and rest in a tub of warm water.   Drink 2-3 glasses of water. Dehydration may cause these contractions.   Do slow and deep breathing several times an hour.  WHEN SHOULD I SEEK IMMEDIATE MEDICAL CARE? Seek immediate medical care if:  Your contractions become stronger, more regular, and closer together.   You have fluid leaking or gushing from your vagina.   You have a fever.   You pass blood-tinged mucus.   You have vaginal bleeding.   You have continuous abdominal pain.   You have low back pain that you never had before.   You feel your baby's head pushing down and causing pelvic pressure.   Your baby is not moving as much as it used to.  Document Released: 08/05/2005 Document Revised: 08/10/2013 Document Reviewed: 05/17/2013 Exit[employer] Patient Information 2015 [employer]. This information is not intended to replace advice given to you by your health care   provider. Make sure you discuss any questions you have with your health care provider.  Fetal Movement Counts Patient Name: __________________________________________________ Patient Due Date: ____________________ Performing a fetal movement count is highly recommended in high-risk pregnancies, but it is good for every pregnant woman to do. Your health care provider may ask you to start counting fetal movements at 28 weeks of the pregnancy. Fetal  movements often increase:  After eating a full meal.  After physical activity.  After eating or drinking something sweet or cold.  At rest. Pay attention to when you feel the baby is most active. This will help you notice a pattern of your baby's sleep and wake cycles and what factors contribute to an increase in fetal movement. It is important to perform a fetal movement count at the same time each day when your baby is normally most active.  HOW TO COUNT FETAL MOVEMENTS 1. Find a quiet and comfortable area to sit or lie down on your left side. Lying on your left side provides the best blood and oxygen circulation to your baby. 2. Write down the day and time on a sheet of paper or in a journal. 3. Start counting kicks, flutters, swishes, rolls, or jabs in a 2-hour period. You should feel at least 10 movements within 2 hours. 4. If you do not feel 10 movements in 2 hours, wait 2-3 hours and count again. Look for a change in the pattern or not enough counts in 2 hours. SEEK MEDICAL CARE IF:  You feel less than 10 counts in 2 hours, tried twice.  There is no movement in over an hour.  The pattern is changing or taking longer each day to reach 10 counts in 2 hours.  You feel the baby is not moving as he or she usually does. Date: ____________ Movements: ____________ Start time: ____________ Finish time: ____________  Date: ____________ Movements: ____________ Start time: ____________ Finish time: ____________ Date: ____________ Movements: ____________ Start time: ____________ Finish time: ____________ Date: ____________ Movements: ____________ Start time: ____________ Finish time: ____________ Date: ____________ Movements: ____________ Start time: ____________ Finish time: ____________ Date: ____________ Movements: ____________ Start time: ____________ Finish time: ____________ Date: ____________ Movements: ____________ Start time: ____________ Finish time: ____________ Date: ____________  Movements: ____________ Start time: ____________ Finish time: ____________  Date: ____________ Movements: ____________ Start time: ____________ Finish time: ____________ Date: ____________ Movements: ____________ Start time: ____________ Finish time: ____________ Date: ____________ Movements: ____________ Start time: ____________ Finish time: ____________ Date: ____________ Movements: ____________ Start time: ____________ Finish time: ____________ Date: ____________ Movements: ____________ Start time: ____________ Finish time: ____________ Date: ____________ Movements: ____________ Start time: ____________ Finish time: ____________ Date: ____________ Movements: ____________ Start time: ____________ Finish time: ____________  Date: ____________ Movements: ____________ Start time: ____________ Finish time: ____________ Date: ____________ Movements: ____________ Start time: ____________ Finish time: ____________ Date: ____________ Movements: ____________ Start time: ____________ Finish time: ____________ Date: ____________ Movements: ____________ Start time: ____________ Finish time: ____________ Date: ____________ Movements: ____________ Start time: ____________ Finish time: ____________ Date: ____________ Movements: ____________ Start time: ____________ Finish time: ____________ Date: ____________ Movements: ____________ Start time: ____________ Finish time: ____________  Date: ____________ Movements: ____________ Start time: ____________ Finish time: ____________ Date: ____________ Movements: ____________ Start time: ____________ Finish time: ____________ Date: ____________ Movements: ____________ Start time: ____________ Finish time: ____________ Date: ____________ Movements: ____________ Start time: ____________ Finish time: ____________ Date: ____________ Movements: ____________ Start time: ____________ Finish time: ____________ Date: ____________ Movements: ____________ Start time:  ____________ Finish time: ____________ Date: ____________ Movements:   ____________ Start time: ____________ Finish time: ____________  Date: ____________ Movements: ____________ Start time: ____________ Finish time: ____________ Date: ____________ Movements: ____________ Start time: ____________ Finish time: ____________ Date: ____________ Movements: ____________ Start time: ____________ Finish time: ____________ Date: ____________ Movements: ____________ Start time: ____________ Finish time: ____________ Date: ____________ Movements: ____________ Start time: ____________ Finish time: ____________ Date: ____________ Movements: ____________ Start time: ____________ Finish time: ____________ Date: ____________ Movements: ____________ Start time: ____________ Finish time: ____________  Date: ____________ Movements: ____________ Start time: ____________ Finish time: ____________ Date: ____________ Movements: ____________ Start time: ____________ Finish time: ____________ Date: ____________ Movements: ____________ Start time: ____________ Finish time: ____________ Date: ____________ Movements: ____________ Start time: ____________ Finish time: ____________ Date: ____________ Movements: ____________ Start time: ____________ Finish time: ____________ Date: ____________ Movements: ____________ Start time: ____________ Finish time: ____________ Date: ____________ Movements: ____________ Start time: ____________ Finish time: ____________  Date: ____________ Movements: ____________ Start time: ____________ Finish time: ____________ Date: ____________ Movements: ____________ Start time: ____________ Finish time: ____________ Date: ____________ Movements: ____________ Start time: ____________ Finish time: ____________ Date: ____________ Movements: ____________ Start time: ____________ Finish time: ____________ Date: ____________ Movements: ____________ Start time: ____________ Finish time: ____________ Date:  ____________ Movements: ____________ Start time: ____________ Finish time: ____________ Date: ____________ Movements: ____________ Start time: ____________ Finish time: ____________  Date: ____________ Movements: ____________ Start time: ____________ Finish time: ____________ Date: ____________ Movements: ____________ Start time: ____________ Finish time: ____________ Date: ____________ Movements: ____________ Start time: ____________ Finish time: ____________ Date: ____________ Movements: ____________ Start time: ____________ Finish time: ____________ Date: ____________ Movements: ____________ Start time: ____________ Finish time: ____________ Date: ____________ Movements: ____________ Start time: ____________ Finish time: ____________ Document Released: 09/04/2006 Document Revised: 12/20/2013 Document Reviewed: 06/01/2012 ExitCare Patient Information 2015 [employer]. This information is not intended to replace advice given to you by your health care provider. Make sure you discuss any questions you have with your health care provider.  

## 2014-03-28 NOTE — Addendum Note (Signed)
Addended by: Asencion Islam on: 03/28/2014 12:30 PM   Modules accepted: Orders

## 2014-04-04 ENCOUNTER — Ambulatory Visit (INDEPENDENT_AMBULATORY_CARE_PROVIDER_SITE_OTHER): Payer: Managed Care, Other (non HMO) | Admitting: Advanced Practice Midwife

## 2014-04-04 ENCOUNTER — Encounter: Payer: Self-pay | Admitting: Advanced Practice Midwife

## 2014-04-04 VITALS — BP 118/71 | HR 121 | Wt 158.0 lb

## 2014-04-04 DIAGNOSIS — Z3403 Encounter for supervision of normal first pregnancy, third trimester: Secondary | ICD-10-CM

## 2014-04-04 DIAGNOSIS — Z3400 Encounter for supervision of normal first pregnancy, unspecified trimester: Secondary | ICD-10-CM

## 2014-04-04 LAB — OB RESULTS CONSOLE GBS: STREP GROUP B AG: NEGATIVE

## 2014-04-04 LAB — OB RESULTS CONSOLE GC/CHLAMYDIA
CHLAMYDIA, DNA PROBE: NEGATIVE
Gonorrhea: NEGATIVE

## 2014-04-04 NOTE — Progress Notes (Signed)
Has Rx for Valtrex suppression. Already taking. GBS done. Declines cervical exam, not many UCs yet. Labor precautions

## 2014-04-04 NOTE — Patient Instructions (Signed)

## 2014-04-05 LAB — GC/CHLAMYDIA PROBE AMP
CT Probe RNA: NEGATIVE
GC Probe RNA: NEGATIVE

## 2014-04-07 ENCOUNTER — Ambulatory Visit: Payer: Managed Care, Other (non HMO) | Admitting: Family Medicine

## 2014-04-07 DIAGNOSIS — Z3009 Encounter for other general counseling and advice on contraception: Secondary | ICD-10-CM

## 2014-04-07 LAB — CULTURE, BETA STREP (GROUP B ONLY)

## 2014-04-07 NOTE — Progress Notes (Signed)
   Subjective:    Patient ID: Annette Koch, female    DOB: 12/03/1990, 23 y.o.   MRN: [id number]  HPI  She is here today to discuss seeing her baby. She is currently pregnant and due in September. So far the pregnancy has gone well and she has not had any complications. Her biggest concern is that she would actually like to delay vaccines. She definitely want her to have them but doesn't want to do them on a delayed schedule. I discussed with her that I am a day Proponent vaccines I am willing to work with her on delaying them. She plans on nursing. Review of Systems     Objective:   Physical Exam        Assessment & Plan:

## 2014-04-08 ENCOUNTER — Encounter: Payer: Self-pay | Admitting: Advanced Practice Midwife

## 2014-04-11 ENCOUNTER — Ambulatory Visit (INDEPENDENT_AMBULATORY_CARE_PROVIDER_SITE_OTHER): Payer: Managed Care, Other (non HMO) | Admitting: Advanced Practice Midwife

## 2014-04-11 VITALS — BP 104/66 | HR 85 | Wt 159.0 lb

## 2014-04-11 DIAGNOSIS — O98519 Other viral diseases complicating pregnancy, unspecified trimester: Secondary | ICD-10-CM

## 2014-04-11 DIAGNOSIS — A6009 Herpesviral infection of other urogenital tract: Secondary | ICD-10-CM

## 2014-04-11 DIAGNOSIS — A6000 Herpesviral infection of urogenital system, unspecified: Secondary | ICD-10-CM

## 2014-04-11 DIAGNOSIS — Z3480 Encounter for supervision of other normal pregnancy, unspecified trimester: Secondary | ICD-10-CM

## 2014-04-11 DIAGNOSIS — O98313 Other infections with a predominantly sexual mode of transmission complicating pregnancy, third trimester: Principal | ICD-10-CM

## 2014-04-11 MED ORDER — VALACYCLOVIR HCL 500 MG PO TABS: 500.0000 mg | ORAL_TABLET | Freq: Two times a day (BID) | ORAL | Status: DC

## 2014-04-11 NOTE — Progress Notes (Signed)
Doing well.  Good fetal movement, denies vaginal bleeding, LOF, regular contractions.  Reviewed signs of labor/birth plan.

## 2014-04-18 ENCOUNTER — Encounter: Payer: Managed Care, Other (non HMO) | Admitting: Advanced Practice Midwife

## 2014-04-22 ENCOUNTER — Ambulatory Visit (INDEPENDENT_AMBULATORY_CARE_PROVIDER_SITE_OTHER): Payer: Managed Care, Other (non HMO) | Admitting: Advanced Practice Midwife

## 2014-04-22 VITALS — BP 106/70 | HR 90 | Wt 159.0 lb

## 2014-04-22 DIAGNOSIS — Z3480 Encounter for supervision of other normal pregnancy, unspecified trimester: Secondary | ICD-10-CM

## 2014-04-22 DIAGNOSIS — Z3491 Encounter for supervision of normal pregnancy, unspecified, first trimester: Secondary | ICD-10-CM

## 2014-04-26 ENCOUNTER — Inpatient Hospital Stay (HOSPITAL_COMMUNITY)
Admission: AD | Admit: 2014-04-26 | Discharge: 2014-04-29 | DRG: 774 | Disposition: A | Discharge status: Home / Self Care | Payer: Managed Care, Other (non HMO) | Source: Ambulatory Visit | Attending: Obstetrics & Gynecology | Admitting: Obstetrics & Gynecology

## 2014-04-26 ENCOUNTER — Encounter (HOSPITAL_COMMUNITY): Payer: Self-pay | Admitting: *Deleted

## 2014-04-26 DIAGNOSIS — O98513 Other viral diseases complicating pregnancy, third trimester: Secondary | ICD-10-CM

## 2014-04-26 DIAGNOSIS — B009 Herpesviral infection, unspecified: Secondary | ICD-10-CM

## 2014-04-26 DIAGNOSIS — I7300 Raynaud's syndrome without gangrene: Secondary | ICD-10-CM | POA: Diagnosis present

## 2014-04-26 DIAGNOSIS — IMO0001 Reserved for inherently not codable concepts without codable children: Secondary | ICD-10-CM

## 2014-04-26 DIAGNOSIS — O98519 Other viral diseases complicating pregnancy, unspecified trimester: Secondary | ICD-10-CM | POA: Diagnosis present

## 2014-04-26 DIAGNOSIS — A6000 Herpesviral infection of urogenital system, unspecified: Secondary | ICD-10-CM | POA: Diagnosis present

## 2014-04-26 DIAGNOSIS — Z8249 Family history of ischemic heart disease and other diseases of the circulatory system: Secondary | ICD-10-CM

## 2014-04-26 NOTE — MAU Note (Signed)
Pt states that contractions started at 12noon today. Pt denies SROM. Denies vaginal bleeding. States positive normal bloody show. Positive fetal movement. Denies n/v/d.

## 2014-04-26 NOTE — Progress Notes (Signed)
Pt instructed to walk for an hour per Nehemiah Settle, MD. Pt asked to return at [facility] for cervix recheck. Pt verbalizes understanding

## 2014-04-27 ENCOUNTER — Encounter (HOSPITAL_COMMUNITY): Payer: Self-pay | Admitting: *Deleted

## 2014-04-27 DIAGNOSIS — IMO0001 Reserved for inherently not codable concepts without codable children: Secondary | ICD-10-CM

## 2014-04-27 DIAGNOSIS — Z8249 Family history of ischemic heart disease and other diseases of the circulatory system: Secondary | ICD-10-CM | POA: Diagnosis not present

## 2014-04-27 DIAGNOSIS — O4290 Premature rupture of membranes, unspecified as to length of time between rupture and onset of labor, unspecified weeks of gestation: Secondary | ICD-10-CM

## 2014-04-27 DIAGNOSIS — O98519 Other viral diseases complicating pregnancy, unspecified trimester: Secondary | ICD-10-CM | POA: Diagnosis present

## 2014-04-27 DIAGNOSIS — I7300 Raynaud's syndrome without gangrene: Secondary | ICD-10-CM | POA: Diagnosis present

## 2014-04-27 DIAGNOSIS — A6000 Herpesviral infection of urogenital system, unspecified: Secondary | ICD-10-CM | POA: Diagnosis present

## 2014-04-27 DIAGNOSIS — O479 False labor, unspecified: Secondary | ICD-10-CM | POA: Diagnosis present

## 2014-04-27 LAB — CBC
HCT: 35.8 % — ABNORMAL LOW (ref 36.0–46.0)
Hemoglobin: 12.4 g/dL (ref 12.0–15.0)
MCH: 30.6 pg (ref 26.0–34.0)
MCHC: 34.6 g/dL (ref 30.0–36.0)
MCV: 88.4 fL (ref 78.0–100.0)
Platelets: 224 10*3/uL (ref 150–400)
RBC: 4.05 MIL/uL (ref 3.87–5.11)
RDW: 13.9 % (ref 11.5–15.5)
WBC: 15.7 10*3/uL — ABNORMAL HIGH (ref 4.0–10.5)

## 2014-04-27 LAB — HIV ANTIBODY (ROUTINE TESTING W REFLEX): HIV: NONREACTIVE

## 2014-04-27 LAB — RPR

## 2014-04-27 MED ORDER — ONDANSETRON HCL 4 MG/2ML IJ SOLN: 4.0000 mg | INTRAMUSCULAR | Status: DC | PRN

## 2014-04-27 MED ORDER — ZOLPIDEM TARTRATE 5 MG PO TABS: 5.0000 mg | ORAL_TABLET | Freq: Every evening | ORAL | Status: DC | PRN

## 2014-04-27 MED ORDER — OXYCODONE-ACETAMINOPHEN 5-325 MG PO TABS: 1.0000 | ORAL_TABLET | ORAL | Status: DC | PRN

## 2014-04-27 MED ORDER — OXYCODONE-ACETAMINOPHEN 5-325 MG PO TABS: 2.0000 | ORAL_TABLET | ORAL | Status: DC | PRN

## 2014-04-27 MED ORDER — CITRIC ACID-SODIUM CITRATE 334-500 MG/5ML PO SOLN: 30.0000 mL | ORAL | Status: DC | PRN

## 2014-04-27 MED ORDER — OXYTOCIN 40 UNITS IN LACTATED RINGERS INFUSION - SIMPLE MED: 62.5000 mL/h | INTRAVENOUS | Status: DC

## 2014-04-27 MED ORDER — LIDOCAINE HCL (PF) 1 % IJ SOLN
30.0000 mL | INTRAMUSCULAR | Status: AC | PRN
  Administered 2014-04-27: 30 mL via SUBCUTANEOUS
  Filled 2014-04-27: qty 30

## 2014-04-27 MED ORDER — LACTATED RINGERS IV SOLN: 500.0000 mL | INTRAVENOUS | Status: DC | PRN

## 2014-04-27 MED ORDER — OXYTOCIN BOLUS FROM INFUSION: 500.0000 mL | INTRAVENOUS | Status: DC

## 2014-04-27 MED ORDER — PRENATAL MULTIVITAMIN CH
1.0000 | ORAL_TABLET | Freq: Every day | ORAL | Status: DC
  Administered 2014-04-28 – 2014-04-29 (×2): 1 via ORAL
  Filled 2014-04-27 (×2): qty 1

## 2014-04-27 MED ORDER — OXYTOCIN 10 UNIT/ML IJ SOLN
10.0000 [IU] | Freq: Once | INTRAMUSCULAR | Status: DC
  Filled 2014-04-27: qty 1

## 2014-04-27 MED ORDER — FLEET ENEMA 7-19 GM/118ML RE ENEM: 1.0000 | ENEMA | RECTAL | Status: DC | PRN

## 2014-04-27 MED ORDER — ONDANSETRON HCL 4 MG/2ML IJ SOLN: 4.0000 mg | Freq: Four times a day (QID) | INTRAMUSCULAR | Status: DC | PRN

## 2014-04-27 MED ORDER — TETANUS-DIPHTH-ACELL PERTUSSIS 5-2.5-18.5 LF-MCG/0.5 IM SUSP: 0.5000 mL | Freq: Once | INTRAMUSCULAR | Status: DC

## 2014-04-27 MED ORDER — DIPHENHYDRAMINE HCL 25 MG PO CAPS: 25.0000 mg | ORAL_CAPSULE | Freq: Four times a day (QID) | ORAL | Status: DC | PRN

## 2014-04-27 MED ORDER — SIMETHICONE 80 MG PO CHEW: 80.0000 mg | CHEWABLE_TABLET | ORAL | Status: DC | PRN

## 2014-04-27 MED ORDER — BENZOCAINE-MENTHOL 20-0.5 % EX AERO
1.0000 "application " | INHALATION_SPRAY | CUTANEOUS | Status: DC | PRN
  Administered 2014-04-27: 1 via TOPICAL
  Filled 2014-04-27: qty 56

## 2014-04-27 MED ORDER — ONDANSETRON HCL 4 MG PO TABS: 4.0000 mg | ORAL_TABLET | ORAL | Status: DC | PRN

## 2014-04-27 MED ORDER — WITCH HAZEL-GLYCERIN EX PADS: 1.0000 "application " | MEDICATED_PAD | CUTANEOUS | Status: DC | PRN

## 2014-04-27 MED ORDER — SENNOSIDES-DOCUSATE SODIUM 8.6-50 MG PO TABS
2.0000 | ORAL_TABLET | ORAL | Status: DC
  Administered 2014-04-28: 2 via ORAL
  Filled 2014-04-27 (×2): qty 2

## 2014-04-27 MED ORDER — ACETAMINOPHEN 325 MG PO TABS: 650.0000 mg | ORAL_TABLET | ORAL | Status: DC | PRN

## 2014-04-27 MED ORDER — DIBUCAINE 1 % RE OINT: 1.0000 "application " | TOPICAL_OINTMENT | RECTAL | Status: DC | PRN

## 2014-04-27 MED ORDER — LANOLIN HYDROUS EX OINT: TOPICAL_OINTMENT | CUTANEOUS | Status: DC | PRN

## 2014-04-27 MED ORDER — IBUPROFEN 600 MG PO TABS
600.0000 mg | ORAL_TABLET | Freq: Four times a day (QID) | ORAL | Status: DC
  Administered 2014-04-27 – 2014-04-29 (×8): 600 mg via ORAL
  Filled 2014-04-27 (×8): qty 1

## 2014-04-27 NOTE — Progress Notes (Signed)
Active baby. No HVS outbreak or prodrome.

## 2014-04-27 NOTE — H&P (Signed)
LABOR ADMISSION HISTORY AND PHYSICAL  Annette Koch is a 23 y.o. female G2P0010 with IUP at [redacted]w[redacted]d by LMP presenting for painful contractions. She reports +FMs, No LOF, no VB, no blurry vision, headaches or peripheral edema, and RUQ pain. She plans on breast feeding. She desires water birth.   Dating: By LMP --->  Estimated Date of Delivery: 04/29/14  Prenatal History/Complications:  Past Medical History: Past Medical History  Diagnosis Date  . PVD (peripheral vascular disease)     unspec  . Other abnormal Papanicolaou smear of cervix and cervical HPV(795.09)   . Raynaud's disease   . Rhus dermatitis   . Rhinitis     allergic nos  . HSV-2 infection     Past Surgical History: Past Surgical History  Procedure Laterality Date  . Tonsillectomy and adenoidectomy  1994    Obstetrical History: OB History   Grav Para Term Preterm Abortions TAB SAB Ect Mult Living   2    1 1     0      Gynecological History: OB History   Grav Para Term Preterm Abortions TAB SAB Ect Mult Living   2    1 1     0      Social History: History   Social History  . Marital Status: Married    Spouse Name: N/A    Number of Children: N/A  . Years of Education: N/A   Occupational History  . childcare giver    Social History Main Topics  . Smoking status: Never Smoker   . Smokeless tobacco: Never Used  . Alcohol Use: Yes     Comment: less often than weekly; when not pregnant  . Drug Use: No  . Sexual Activity: Yes    Partners: Male    Birth Control/ Protection: Condom, None   Other Topics Concern  . Not on file   Social History Narrative  . No narrative on file    Family History: Family History  Problem Relation Age of Onset  . Hypertension Maternal Grandmother   . Cancer - Lung Maternal Grandfather   . Cancer Paternal Grandfather     Allergies: No Known Allergies  Prescriptions prior to admission  Medication Sig Dispense Refill  . Prenat w/o A Vit-FeFum-FePo-FA (CONCEPT OB)  130-92.4-1 MG CAPS Take 1 capsule by mouth daily.  30 capsule  11  . doxylamine, Sleep, (UNISOM) 25 MG tablet Take 25 mg by mouth at bedtime as needed (taking 1/2 tab prn nausea).      . pyridOXINE (B-6) 50 MG tablet Take 50 mg by mouth daily.      . valACYclovir (VALTREX) 500 MG tablet Take 1 tablet (500 mg total) by mouth 2 (two) times daily.  30 tablet  0     Review of Systems   All systems reviewed and negative except as stated in HPI  Blood pressure 112/71, pulse 96, resp. rate 18, last menstrual period 07/23/2013. General appearance: alert, cooperative and mild distress painfully contracting Abdomen: soft, non-tender; bowel sounds normal Pelvic: 4/70/-2 by nurse Presentation: cephalic Fetal monitoringBaseline: 135 bpm, Variability: Good {> 6 bpm), Accelerations: Reactive and Decelerations: Absent Uterine activity Regular every 2-3 Dilation: 4 Effacement (%): 70 Station: -3 Exam by:: Jeannene Patella RN   Prenatal labs: ABO, Rh: A/POS/-- (02/02 1154) Antibody: NEG (02/02 1154) Rubella:   RPR: NON REAC (06/19 0940)  HBsAg: NEGATIVE (02/02 1154)  HIV: NONREACTIVE (06/19 0940)  GBS: Negative (08/17 0000)  1 hr Glucola 90 Genetic screening  Declined  Anatomy US Normal, female   Prenatal Transfer Tool  Maternal Diabetes: No Genetic Screening: Declined Maternal Ultrasounds/Referrals: Normal Fetal Ultrasounds or other Referrals:  None Maternal Substance Abuse:  No Significant Maternal Medications:  Meds include: Other: Valcyclovir Significant Maternal Lab Results: Lab values include: Group B Strep negative; HSV positive, on ppx     No results found for this or any previous visit (from the past 24 hour(s)).  Patient Active Problem List   Diagnosis Date Noted  . HSV-2 infection complicating pregnancy 07/68/0881  . Supervision of normal pregnancy in first trimester 09/20/2013  . Pregnancy 08/20/2013  . Myofascial pain syndrome 06/11/2012  . Atypical chest pain 01/14/2012  .  Left wrist injury 06/29/2011  . Injury of left hand 06/29/2011  . PALPITATIONS 04/26/2009  . PAP SMEAR, LGSIL, ABNORMAL 04/26/2009  . RAYNAUD'S DISEASE 02/03/2009  . UNSPECIFIED PERIPHERAL VASCULAR DISEASE 02/01/2009  . RHINITIS, ALLERGIC NOS 05/28/2007    Assessment: Annette Koch is a 23 y.o. G2P0010 at [redacted]w[redacted]d here for active labor at term. In early labor but painfully contracting, regularly and uncomfortable with going home.   #Labor:Expectant Management. Plans for water birth, midwives aware.  #HSV: On ppx since 34 weeks, no active lesions on examination. This should not prevent her from having a water birth. Discussed with on-call midwife and attending physician, who are in agreement.  #Pain: Labor support without medications #FWB: Category I. Plan for intermittent monitoring.  #ID:  GBS negative. HSV on prophylaxis, no lesions on examination.  #MOF: Breast #MOC:Need to clarify.  #Circ:  N/A Female  Annette Koch 04/27/2014, 1:27 AM

## 2014-04-27 NOTE — Progress Notes (Addendum)
Patient ID: Annette Koch, female   DOB: 1990-12-28, 23 y.o.   MRN: [id number] Annette Koch is a 23 y.o. G2P0010 at [redacted]w[redacted]d admitted for SOL  Subjective: Coping well in pool. Has been pushing well past 30 minutes.   Objective: BP 114/71  Pulse 100  Temp(Src) 98.4 F (36.9 C) (Oral)  Resp 20  Ht 5\' 4"  (1.626 m)  Wt 159 lb (72.122 kg)  BMI 27.28 kg/m2  SpO2 98%  LMP 07/23/2013  Fetal Heart FHR: 145 bpm, variability: moderate,  accelerations:  Present,  decelerations:  Absent  On intermittent monitoring. Contractions: q13min  SVE:   Dilation: 8.5 (bulging forebag) Effacement (%): 100 Station: 0 Exam by:: S Nix RN Per Fatima Blank CNM C/C/+2. Per RN light mec prior to SROM forebag> blood-tinged watery  Assessment / Plan:  Labor: Active 2nd stage Fetal Wellbeing: Category 1 on intermittent EFM Pain Control:  coping Expected mode of delivery: NSVD  Annette Koch 04/27/2014, 12:08 PM

## 2014-04-28 MED ORDER — NORETHINDRONE 0.35 MG PO TABS: 1.0000 | ORAL_TABLET | Freq: Every day | ORAL | Status: DC

## 2014-04-28 NOTE — Lactation Note (Signed)
This note was copied from the chart of Annette Koch. Lactation Consultation Note Mom had just finished BF baby when entered rm. Mom has good nipples for BF, states slightly tender, encouraged to hand express colostrum and rub on nipples for soreness. Noted of concern that mom has limited breast tissue and very wide space between breast. Very slightly asymetrical of Rt. Breast slightly larger than Lt. Breast. Easily flowing colostrum. Mom encouraged to feed baby 8-12 times/24 hours and with feeding cues. Mom stating she thinks the baby is cluster feeding and wants to just hang out at the breast.  Educated about newborn behavior. Mom encouraged to waken baby for feeds. Referred to Baby and Me Book in Breastfeeding section Pg. 22-23 for position options and Proper latch demonstration.[place] brochure given w/resources, support groups and [place] services.Encouraged comfort during BF so colostrum flows better and mom will enjoy the feeding longer. Taking deep breaths and breast massage during BF. Encouraged fluids and monitoring feeding I&O. Encouraged to call for assistance if needed and to verify proper latch. Patient Name: Annette Koch WNUUV'O Date: 04/28/2014 Reason for consult: Initial assessment   Maternal Data Has patient been taught Hand Expression?: Yes Does the patient have breastfeeding experience prior to this delivery?: No  Feeding Feeding Type: Breast Fed Length of feed: 15 min  LATCH Score/Interventions Latch: Grasps breast easily, tongue down, lips flanged, rhythmical sucking.  Audible Swallowing: A few with stimulation Intervention(s): Skin to skin;Hand expression;Alternate breast massage  Type of Nipple: Everted at rest and after stimulation  Comfort (Breast/Nipple): Filling, red/small blisters or bruises, mild/mod discomfort  Problem noted: Mild/Moderate discomfort Interventions (Mild/moderate discomfort): Hand expression  Hold (Positioning): No assistance needed to  correctly position infant at breast.  LATCH Score: 8  Lactation Tools Discussed/Used     Consult Status Consult Status: Follow-up Date: 04/28/14 Follow-up type: In-patient    Theodoro Kalata 04/28/2014, 2:46 AM

## 2014-04-28 NOTE — Discharge Summary (Signed)
Obstetric Discharge Summary Reason for Admission: onset of labor Prenatal Procedures: ultrasound Intrapartum Procedures: spontaneous vaginal delivery and Waterbirth Postpartum Procedures: none Complications-Operative and Postpartum: 2nd degree perineal laceration Hemoglobin  Date Value Ref Range Status  04/27/2014 12.4  12.0 - 15.0 g/dL Final     HCT  Date Value Ref Range Status  04/27/2014 35.8* 36.0 - 46.0 % Final    Physical Exam:  General: alert, cooperative and no distress Lochia: appropriate Uterine Fundus: firm Incision: NA DVT Evaluation: No evidence of DVT seen on physical exam.  Discharge Diagnoses: Term Pregnancy-delivered  Discharge Information: Date: 04/28/2014 Activity: pelvic rest Diet: routine Medications: PNV and Ibuprofen Condition: stable Instructions: refer to practice specific booklet Discharge to: home Follow-up Information   Follow up with [facility] In 6 weeks.   Specialty:  Obstetrics and Gynecology   Contact information:   [address] [place], [place] [address] [phone]      Follow up with [facility]. (As needed in emergencies)    Contact information:   [address] [id number] [place]  [address] [phone]      Newborn Data: Live born female  Birth Weight: 7 lb 0.2 oz (3181 g) APGAR: 9, 9  Home with mother.  Annette Koch 04/28/2014, 11:08 AM

## 2014-04-28 NOTE — Discharge Instructions (Signed)
Levonorgestrel intrauterine device (IUD) What is this medicine? LEVONORGESTREL IUD (LEE voe nor jes trel) is a contraceptive (birth control) device. The device is placed inside the uterus by a healthcare professional. It is used to prevent pregnancy and can also be used to treat heavy bleeding that occurs during your period. Depending on the device, it can be used for 3 to 5 years. This medicine may be used for other purposes; ask your health care provider or pharmacist if you have questions. COMMON BRAND NAME(S): LILETTA, Mirena, Skyla What should I tell my health care provider before I take this medicine? They need to know if you have any of these conditions: -abnormal Pap smear -cancer of the breast, uterus, or cervix -diabetes -endometritis -genital or pelvic infection now or in the past -have more than one sexual partner or your partner has more than one partner -heart disease -history of an ectopic or tubal pregnancy -immune system problems -IUD in place -liver disease or tumor -problems with blood clots or take blood-thinners -use intravenous drugs -uterus of unusual shape -vaginal bleeding that has not been explained -an unusual or allergic reaction to levonorgestrel, other hormones, silicone, or polyethylene, medicines, foods, dyes, or preservatives -pregnant or trying to get pregnant -breast-feeding How should I use this medicine? This device is placed inside the uterus by a health care professional. Talk to your pediatrician regarding the use of this medicine in children. Special care may be needed. Overdosage: If you think you have taken too much of this medicine contact a poison control center or emergency room at once. NOTE: This medicine is only for you. Do not share this medicine with others. What if I miss a dose? This does not apply. What may interact with this medicine? Do not take this medicine with any of the following  medications: -amprenavir -bosentan -fosamprenavir This medicine may also interact with the following medications: -aprepitant -barbiturate medicines for inducing sleep or treating seizures -bexarotene -griseofulvin -medicines to treat seizures like carbamazepine, ethotoin, felbamate, oxcarbazepine, phenytoin, topiramate -modafinil -pioglitazone -rifabutin -rifampin -rifapentine -some medicines to treat HIV infection like atazanavir, indinavir, lopinavir, nelfinavir, tipranavir, ritonavir -St. John's wort -warfarin This list may not describe all possible interactions. Give your health care provider a list of all the medicines, herbs, non-prescription drugs, or dietary supplements you use. Also tell them if you smoke, drink alcohol, or use illegal drugs. Some items may interact with your medicine. What should I watch for while using this medicine? Visit your doctor or health care professional for regular check ups. See your doctor if you or your partner has sexual contact with others, becomes HIV positive, or gets a sexual transmitted disease. This product does not protect you against HIV infection (AIDS) or other sexually transmitted diseases. You can check the placement of the IUD yourself by reaching up to the top of your vagina with clean fingers to feel the threads. Do not pull on the threads. It is a good habit to check placement after each menstrual period. Call your doctor right away if you feel more of the IUD than just the threads or if you cannot feel the threads at all. The IUD may come out by itself. You may become pregnant if the device comes out. If you notice that the IUD has come out use a backup birth control method like condoms and call your health care provider. Using tampons will not change the position of the IUD and are okay to use during your period. What side effects may   I notice from receiving this medicine? Side effects that you should report to your doctor or  health care professional as soon as possible: -allergic reactions like skin rash, itching or hives, swelling of the face, lips, or tongue -fever, flu-like symptoms -genital sores -high blood pressure -no menstrual period for 6 weeks during use -pain, swelling, warmth in the leg -pelvic pain or tenderness -severe or sudden headache -signs of pregnancy -stomach cramping -sudden shortness of breath -trouble with balance, talking, or walking -unusual vaginal bleeding, discharge -yellowing of the eyes or skin Side effects that usually do not require medical attention (report to your doctor or health care professional if they continue or are bothersome): -acne -breast pain -change in sex drive or performance -changes in weight -cramping, dizziness, or faintness while the device is being inserted -headache -irregular menstrual bleeding within first 3 to 6 months of use -nausea This list may not describe all possible side effects. Call your doctor for medical advice about side effects. You may report side effects to FDA at 1-800-FDA-1088. Where should I keep my medicine? This does not apply. NOTE: This sheet is a summary. It may not cover all possible information. If you have questions about this medicine, talk to your doctor, pharmacist, or health care provider.  2015, Elsevier/Gold Standard. (2011-09-05 13:54:04)  

## 2014-04-28 NOTE — Lactation Note (Signed)
This note was copied from the chart of [name]. Lactation Consultation Note Follow up consult with this mom of a term baby, now 70 hours old, and at 6.8% weight loss. The baby has had 5 wet diapers and 2 stools, so this is part of her loss. Mom has been breast feeding, hearing swallows when she eats, but she has been very sleepy at the breast also. With hand expression, I was able to express 0.5 mls, and showed mom and dad how to cup feed with foley cup. Mom has small breasts, somewhat wide spaced, but normal in appearance. i started her pumping with DEP in premie setting, and decreased her to 21 flanges, with good fit. Mom was expressing colostrum with the pump, and will folow with hand expression. Mom was advised to feed baby by cup what ever she expresses, and then try and latch baby to breast. I am hoping that EBM will increase her energy and appetite. i also advid=sed mom to put baby to breast at least every 3 hours or with cues. Mom knows to call for questions/concerns.  Patient Name: [name] JOITG'P Date: 04/28/2014 Reason for consult: Follow-up assessment   Maternal Data    Feeding Feeding Type: Breast Fed Length of feed: 5 min  LATCH Score/Interventions Latch: Grasps breast easily, tongue down, lips flanged, rhythmical sucking.  Audible Swallowing: None Intervention(s): Skin to skin  Type of Nipple: Everted at rest and after stimulation  Comfort (Breast/Nipple): Soft / non-tender     Hold (Positioning): No assistance needed to correctly position infant at breast.  LATCH Score: 8  Lactation Tools Discussed/Used Pump Review: Setup, frequency, and cleaning;Milk Storage;Other (comment) (hand expression reviewed, premie setting for next 24 hours) Initiated by:: clee rn lc Date initiated:: 04/29/14   Consult Status Consult Status: Follow-up Date: 04/29/14 Follow-up type: In-patient    Tonna Corner 04/28/2014, 3:57 PM

## 2014-04-28 NOTE — Progress Notes (Signed)
Patient continues to progress well with postpartum healing. She is scheduled for 1400 discharge to home per RN. Spouse at bedside all AM.  Production assistant, radio, Erskine Speed with [employer])

## 2014-04-29 ENCOUNTER — Encounter: Payer: Managed Care, Other (non HMO) | Admitting: Family

## 2014-04-29 MED ORDER — NORETHINDRONE 0.35 MG PO TABS: 1.0000 | ORAL_TABLET | Freq: Every day | ORAL | Status: DC

## 2014-04-29 MED ORDER — IBUPROFEN 600 MG PO TABS: 600.0000 mg | ORAL_TABLET | Freq: Four times a day (QID) | ORAL | Status: DC

## 2014-04-29 NOTE — Discharge Summary (Signed)
Obstetric Discharge Summary Reason for Admission: onset of labor Prenatal Procedures: ultrasound Intrapartum Procedures: water SVD Postpartum Procedures: none Complications-Operative and Postpartum: 2nd degree perineal and right labium at clitoral hood Hemoglobin  Date Value Ref Range Status  04/27/2014 12.4  12.0 - 15.0 g/dL Final     HCT  Date Value Ref Range Status  04/27/2014 35.8* 36.0 - 46.0 % Final    Physical Exam:  General: alert, cooperative and no distress Lochia: appropriate Uterine Fundus: firm Incision: healing well DVT Evaluation: No evidence of DVT seen on physical exam.  Discharge Diagnoses: Term Pregnancy-delivered  Discharge Information: Date: 04/29/2014 Activity: unrestricted and pelvic rest Diet: routine Medications: Ibuprofen and Micronor Condition: stable Instructions: refer to practice specific booklet Discharge to: home Follow-up Information   Follow up with [facility] [place] In 6 weeks.   Specialty:  Obstetrics and Gynecology   Contact information:   [address], [place] [address] [phone]      Follow up with [facility]. (As needed in emergencies)    Contact information:   [address] [id number] [place] [address] [phone]      Newborn Data: Live born female  Birth Weight: 7 lb 0.2 oz (3181 g) APGAR: 9, 9  Home with mother.  Pt seen and examined separately by Christin Fudge, CNM.  Annette Koch 04/29/2014, 7:42 AM  I have seen and examined this patient and agree the above assessment. CRESENZO-DISHMAN,Annette Koch 05/03/2014 10:17 AM

## 2014-04-29 NOTE — Lactation Note (Signed)
This note was copied from the chart of Annette Gerene Nedd. Lactation Consultation Note Baby has 9% weight loss. Breast feeding well. Weight loss could be coming from spitting up, pee's, and poop's since birth. Baby having good output, heard spontaneous swallows, mom's breast are filling w/easily expressed colostrum. Witnessed deep latching w/wide flange. Discussed feedings and transferring of milk from breast, I feel baby is getting a good transfer. Mom had good everted nipples. Has small breast but filling. Discussed engorgement/prevention/treatment. Reminded about out pt. Services and support groups if needed.  Patient Name: Annette Koch [id number]'T Date: 04/29/2014 Reason for consult: Follow-up assessment;Infant weight loss   Maternal Data    Feeding Feeding Type: Breast Fed Length of feed: 15 min (still feeding)  LATCH Score/Interventions Latch: Grasps breast easily, tongue down, lips flanged, rhythmical sucking.  Audible Swallowing: Spontaneous and intermittent Intervention(s): Skin to skin;Hand expression Intervention(s): Alternate breast massage  Type of Nipple: Everted at rest and after stimulation  Comfort (Breast/Nipple): Soft / non-tender  Interventions (Mild/moderate discomfort): Hand massage  Hold (Positioning): Assistance needed to correctly position infant at breast and maintain latch. Intervention(s): Support Pillows;Skin to skin  LATCH Score: 9  Lactation Tools Discussed/Used     Consult Status Consult Status: PRN Date: 04/29/14 Follow-up type: In-patient    Chip Canepa, Elta Guadeloupe 04/29/2014, 3:01 AM

## 2014-05-02 NOTE — H&P (Signed)
Attestation of Attending Supervision of Obstetric Fellow: Evaluation and management procedures were performed by the Obstetric Fellow under my supervision and collaboration.  I have reviewed the Obstetric Fellow's note and chart, and I agree with the management and plan.  Eliseo Withers, DO Attending Physician Faculty Practice, [facility]  

## 2014-05-06 NOTE — Progress Notes (Signed)
Post discharge chart review completed.  

## 2014-05-09 ENCOUNTER — Encounter: Payer: Self-pay | Admitting: *Deleted

## 2014-06-06 IMAGING — CT CT ABD-PELV W/ CM
2 of 4 series · 16 of 46 positions shown, 18 images · IV contrast (APPLIED)
Comparison: None.

CLINICAL DATA: Right lower quadrant pain and nausea.  History T T
P.  Appendicitis.

CT ABDOMEN AND PELVIS WITH CONTRAST
TECHNIQUE: Multidetector CT imaging of the abdomen and pelvis was
performed following the standard protocol during bolus
administration of intravenous contrast.
Contrast: 100mL OMNIPAQUE IOHEXOL 300 MG/ML  SOLN

[Series 2: abd/pelvis 5.0 b31f · axial · 0.58mm/px · z∈[-229,+156]mm · 13 of 85 slices shown, 15 images]
[im 4/85  soft-tissue]
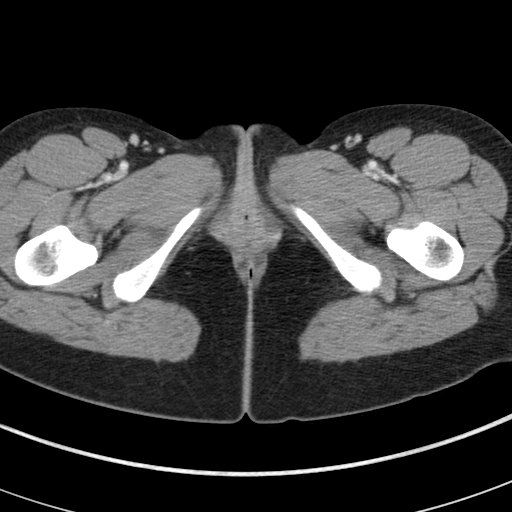
[im 4/85  bone]
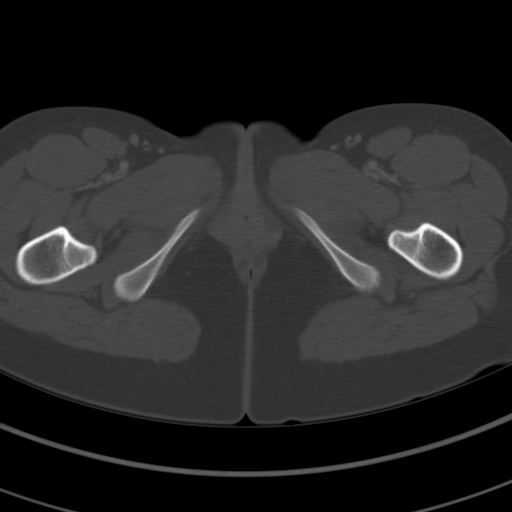
[im 10/85  soft-tissue]
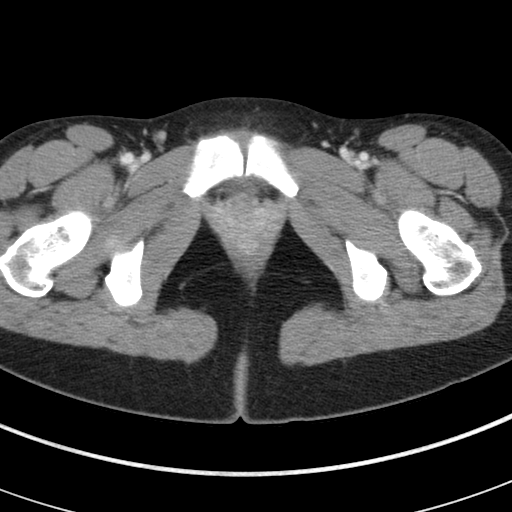
[im 17/85  soft-tissue]
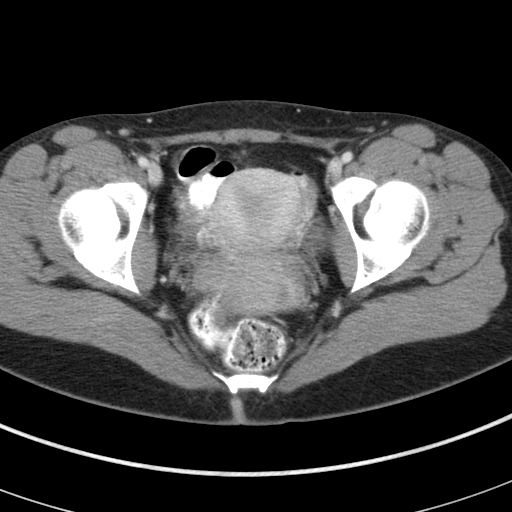
[im 23/85  soft-tissue]
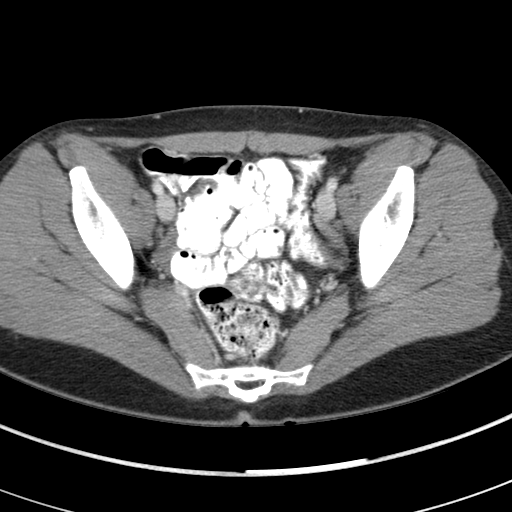
[im 30/85  soft-tissue]
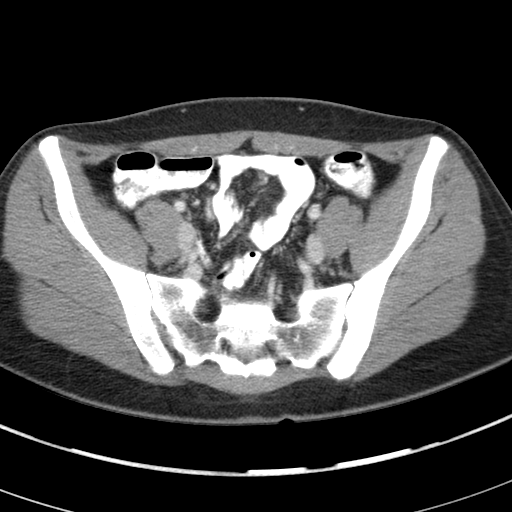
[im 36/85  soft-tissue]
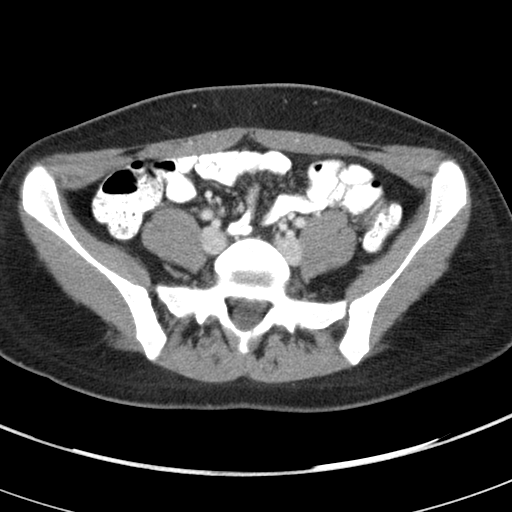
[im 43/85  soft-tissue]
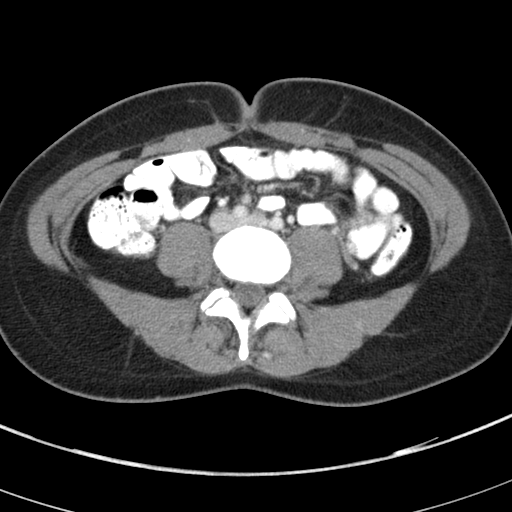
[im 49/85  soft-tissue]
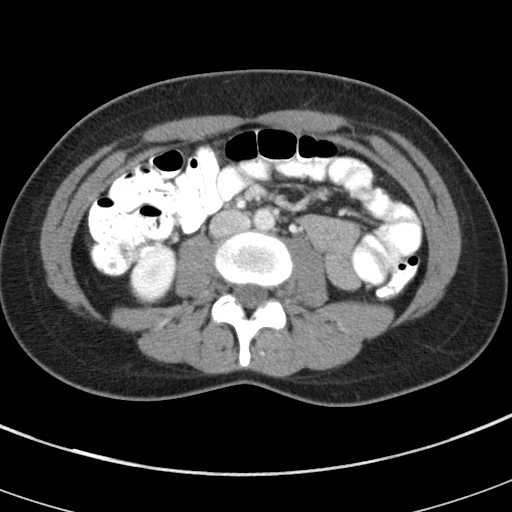
[im 55/85  soft-tissue]
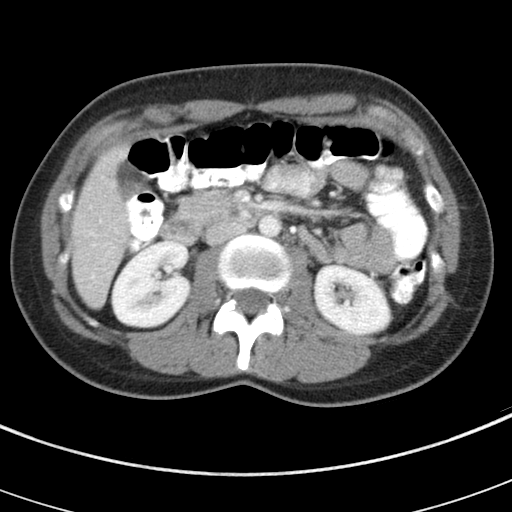
[im 55/85  bone]
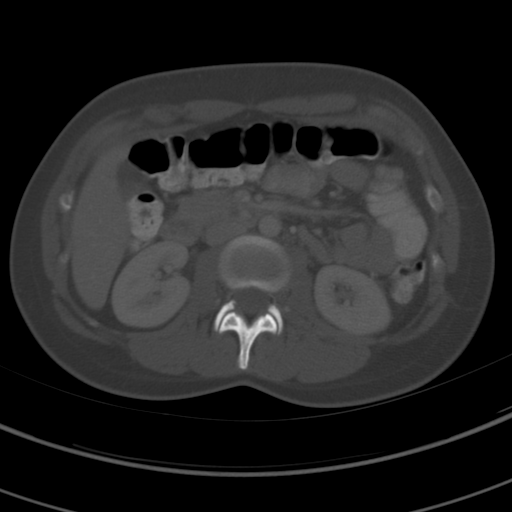
[im 62/85  soft-tissue]
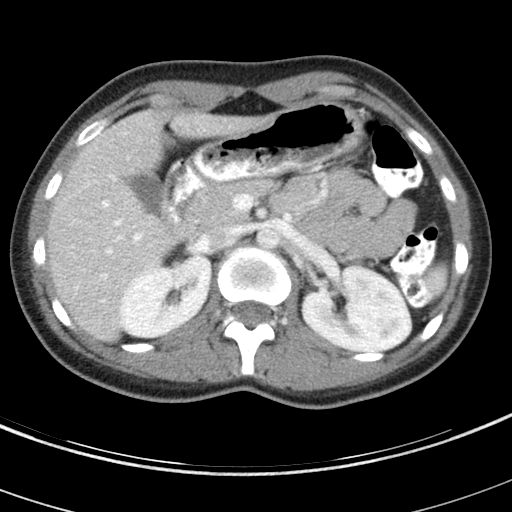
[im 68/85  soft-tissue]
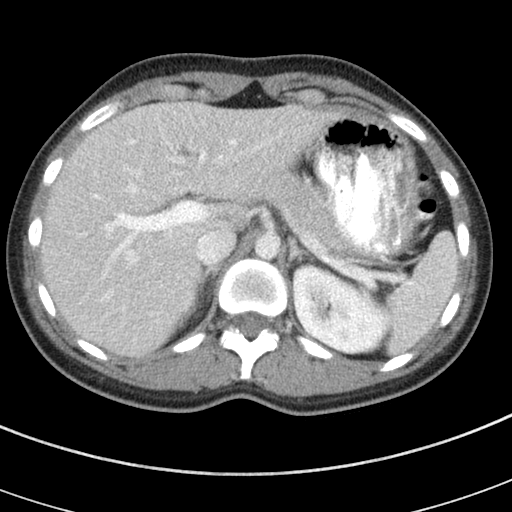
[im 75/85  soft-tissue]
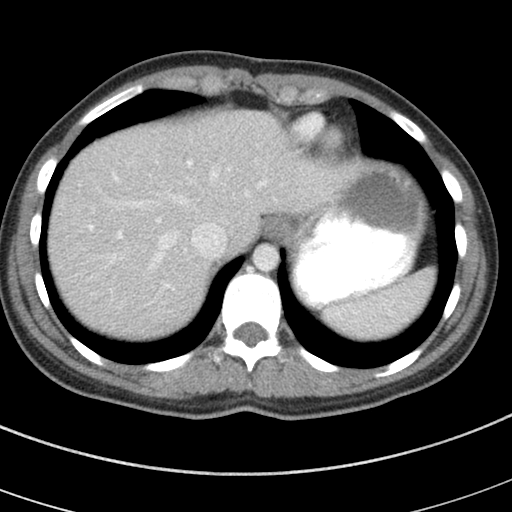
[im 81/85  soft-tissue]
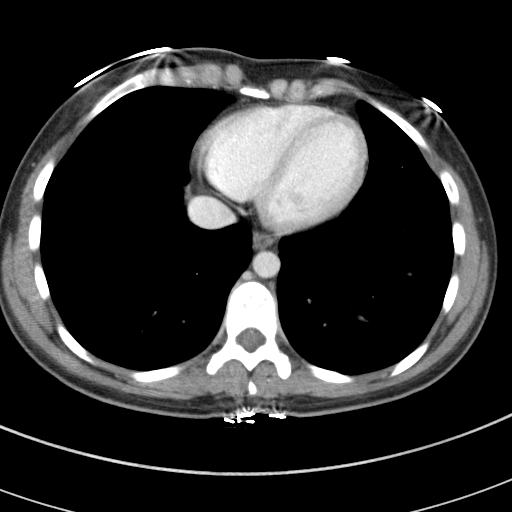

[Series 5: abd/pelvis 3.0 coronal · coronal · 0.63mm/px · 3 of 70 slices shown]
[im 24/70  soft-tissue]
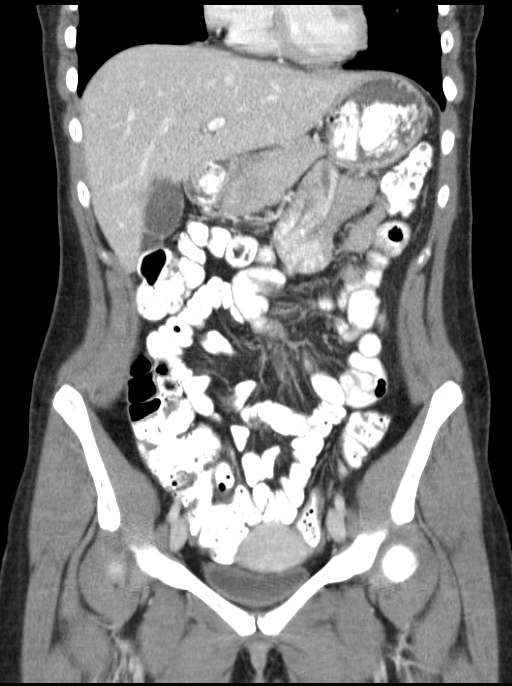
[im 31/70  soft-tissue]
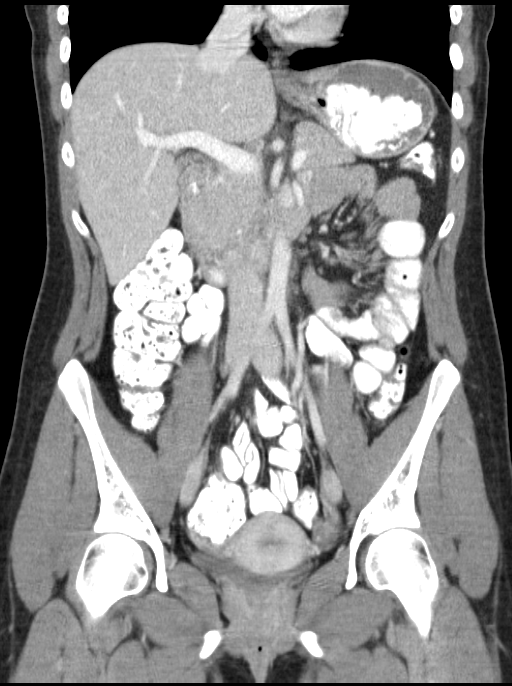
[im 39/70  soft-tissue]
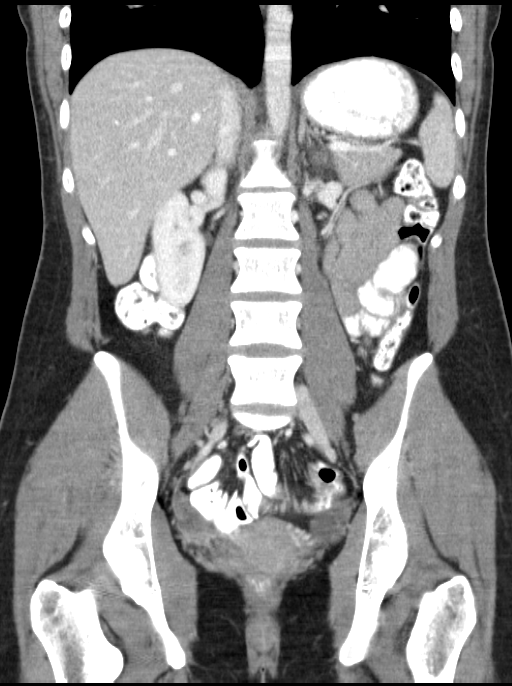

[16 of 46 positions shown; findings below may reference images not displayed]

FINDINGS: Lung Bases: Normal.

Liver:  Normal.

Spleen:  Normal.

Gallbladder:  Normal.

Common bile duct:  Normal.

Pancreas:  Normal.

Adrenal glands:  Normal.

Kidneys:  Normal enhancement.  Both ureters appear within normal
limits.

Stomach:  Normal.

Small bowel:  Normal.  No obstruction.  No mesenteric adenopathy.

Colon:   Normal appendix.  Distal colon appears normal.

Pelvic Genitourinary:  Physiologic appearance of the uterus and
ovaries.  Urinary bladder normal.  Physiologic free fluid in the
anatomic pelvis.

Bones:  No aggressive osseous lesions.

Vasculature: Normal.

Body Wall: Normal.
IMPRESSION: Normal CT abdomen and pelvis.  Negative for appendicitis.

## 2014-06-10 ENCOUNTER — Ambulatory Visit (INDEPENDENT_AMBULATORY_CARE_PROVIDER_SITE_OTHER): Payer: Managed Care, Other (non HMO) | Admitting: Advanced Practice Midwife

## 2014-06-10 ENCOUNTER — Encounter: Payer: Self-pay | Admitting: Advanced Practice Midwife

## 2014-06-10 VITALS — BP 102/65 | HR 83 | Resp 16 | Ht 65.0 in | Wt 146.0 lb

## 2014-06-10 DIAGNOSIS — Z30430 Encounter for insertion of intrauterine contraceptive device: Secondary | ICD-10-CM

## 2014-06-10 DIAGNOSIS — Z01812 Encounter for preprocedural laboratory examination: Secondary | ICD-10-CM

## 2014-06-10 DIAGNOSIS — Z975 Presence of (intrauterine) contraceptive device: Secondary | ICD-10-CM

## 2014-06-10 LAB — POCT URINE PREGNANCY: Preg Test, Ur: NEGATIVE

## 2014-06-10 MED ORDER — LEVONORGESTREL 20 MCG/24HR IU IUD
INTRAUTERINE_SYSTEM | Freq: Once | INTRAUTERINE | Status: AC
Start: 2014-06-10 — End: 2014-06-10
  Administered 2014-06-10: 11:00:00 via INTRAUTERINE

## 2014-06-10 NOTE — Progress Notes (Signed)
  Subjective:     Annette Koch is a 23 y.o. female who presents for a postpartum visit. She is 6 weeks postpartum following a spontaneous vaginal delivery. I have fully reviewed the prenatal and intrapartum course. Outcome: spontaneous vaginal delivery. Anesthesia: none. Postpartum course has been normal. Baby's course has been normal. Baby is feeding by breast. Bleeding no bleeding. Bowel function is normal. Bladder function is normal. Patient is sexually active. Contraception method is none. Postpartum depression screening: negative.  The following portions of the patient's history were reviewed and updated as appropriate: allergies, current medications, past family history, past medical history, past social history, past surgical history and problem list.  Review of Systems A comprehensive review of systems was negative.   Objective:    BP 102/65  Pulse 83  Resp 16  Ht 5\' 5"  (1.651 m)  Wt 146 lb (66.225 kg)  BMI 24.30 kg/m2  Breastfeeding? Yes  General:  alert, cooperative and appears stated age   Breasts:  inspection negative, no nipple discharge or bleeding, no masses or nodularity palpable  Lungs: no respiratory distress, acyanotic  Heart:    Abdomen: soft, non-tender; bowel sounds normal; no masses,  no organomegaly   Vulva:  normal  Vagina: normal vagina and well healed after sutures, no sutures visible  Cervix:  multiparous appearance  Corpus: normal size, contour, position, consistency, mobility, non-tender  Adnexa:  normal adnexa and no mass, fullness, tenderness  Rectal Exam: Not performed.         IUD Procedure Note Patient identified, informed consent performed.  Discussed risks of irregular bleeding, cramping, infection, malpositioning or misplacement of the IUD outside the uterus which may require further procedures. Time out was performed.  Urine pregnancy test negative.  Speculum placed in the vagina.  Cervix visualized.  Cleaned with Betadine x 2.  Grasped  anteriorly with a single tooth tenaculum.  Uterus sounded to 6 cm.  Mirena IUD placed per manufacturer's recommendations.  Strings trimmed to 3 cm. Tenaculum was removed, good hemostasis noted.  Patient tolerated procedure well.   Patient was given post-procedure instructions and the Mirena care card with expiration date.  Patient was also asked to check IUD strings periodically and follow up in 4-6 weeks for IUD check.  Assessment:     Normal postpartum exam. Mirena IUD placement today for contraception.    Plan:    1. Contraception: IUD 2. Follow up in: 4 weeks for string check

## 2014-06-20 ENCOUNTER — Encounter: Payer: Self-pay | Admitting: Advanced Practice Midwife

## 2014-07-04 ENCOUNTER — Ambulatory Visit (INDEPENDENT_AMBULATORY_CARE_PROVIDER_SITE_OTHER): Payer: Managed Care, Other (non HMO) | Admitting: Advanced Practice Midwife

## 2014-07-04 ENCOUNTER — Encounter: Payer: Self-pay | Admitting: Advanced Practice Midwife

## 2014-07-04 VITALS — BP 112/66 | HR 85 | Resp 16 | Ht 65.0 in | Wt 142.0 lb

## 2014-07-04 DIAGNOSIS — Z975 Presence of (intrauterine) contraceptive device: Secondary | ICD-10-CM | POA: Insufficient documentation

## 2014-07-04 DIAGNOSIS — Z30431 Encounter for routine checking of intrauterine contraceptive device: Secondary | ICD-10-CM

## 2014-07-04 NOTE — Patient Instructions (Signed)
Intrauterine Device Information An intrauterine device (IUD) is inserted into your uterus to prevent pregnancy. There are two types of IUDs available:   Copper IUD--This type of IUD is wrapped in copper wire and is placed inside the uterus. Copper makes the uterus and fallopian tubes produce a fluid that kills sperm. The copper IUD can stay in place for 10 years.  Hormone IUD--This type of IUD contains the hormone progestin (synthetic progesterone). The hormone thickens the cervical mucus and prevents sperm from entering the uterus. It also thins the uterine lining to prevent implantation of a fertilized egg. The hormone can weaken or kill the sperm that get into the uterus. One type of hormone IUD can stay in place for 5 years, and another type can stay in place for 3 years. Your health care provider will make sure you are a good candidate for a contraceptive IUD. Discuss with your health care provider the possible side effects.  ADVANTAGES OF AN INTRAUTERINE DEVICE  IUDs are highly effective, reversible, long acting, and low maintenance.   There are no estrogen-related side effects.   An IUD can be used when breastfeeding.   IUDs are not associated with weight gain.   The copper IUD works immediately after insertion.   The hormone IUD works right away if inserted within 7 days of your period starting. You will need to use a backup method of birth control for 7 days if the hormone IUD is inserted at any other time in your cycle.  The copper IUD does not interfere with your female hormones.   The hormone IUD can make heavy menstrual periods lighter and decrease cramping.   The hormone IUD can be used for 3 or 5 years.   The copper IUD can be used for 10 years. DISADVANTAGES OF AN INTRAUTERINE DEVICE  The hormone IUD can be associated with irregular bleeding patterns.   The copper IUD can make your menstrual flow heavier and more painful.   You may experience cramping and  vaginal bleeding after insertion.  Document Released: 07/09/2004 Document Revised: 04/07/2013 Document Reviewed: 01/24/2013 ExitCare Patient Information 2015 [employer]. This information is not intended to replace advice given to you by your health care provider. Make sure you discuss any questions you have with your health care provider.  

## 2014-07-04 NOTE — Progress Notes (Signed)
   Subjective:    Patient ID: Annette Koch, female    DOB: 09-Feb-1991, 23 y.o.   MRN: [id number]  HPI  This is a 23 y.o. female who is here for an IUD string check. She had a Mirena IUD placed on 06/10/14.  She has had no problems with cramping or bleeding.    Review of Systems  Constitutional: Negative for appetite change and fatigue.  Genitourinary: Negative for vaginal bleeding, difficulty urinating and pelvic pain.       Objective:   Physical Exam  Constitutional: She is oriented to person, place, and time. She appears well-developed and well-nourished.  HENT:  Head: Normocephalic.  Cardiovascular: Normal rate.   Pulmonary/Chest: Effort normal.  Abdominal: Soft. There is no tenderness. There is no rebound and no guarding.  Genitourinary: Vaginal discharge (mucous) found.  String visible  Musculoskeletal: Normal range of motion.  Neurological: She is alert and oriented to person, place, and time.  Skin: Skin is warm and dry.  Psychiatric: She has a normal mood and affect.          Assessment & Plan:  A:  IUD string check normal  P:  Continue normal activity       Follow up as needed

## 2016-02-12 ENCOUNTER — Ambulatory Visit (INDEPENDENT_AMBULATORY_CARE_PROVIDER_SITE_OTHER): Payer: BLUE CROSS/BLUE SHIELD | Admitting: Obstetrics and Gynecology

## 2016-02-12 ENCOUNTER — Encounter: Payer: Self-pay | Admitting: Obstetrics and Gynecology

## 2016-02-12 VITALS — BP 119/74 | HR 84 | Resp 16 | Ht 65.0 in | Wt 129.0 lb

## 2016-02-12 DIAGNOSIS — Z30432 Encounter for removal of intrauterine contraceptive device: Secondary | ICD-10-CM

## 2016-02-12 NOTE — Progress Notes (Signed)
25 yo G1P1 presenting today for IUD removal. Patient has had the IUD in place since 05/2014. She wishes to have it removed for no particular reason. Patient does not desire any further contraception.She is not planning to conceive but would welcome a pregnancy if it were to happen  Past Medical History  Diagnosis Date  . PVD (peripheral vascular disease) (HCC)     unspec  . Other abnormal Papanicolaou smear of cervix and cervical HPV(795.09)   . Raynaud's disease   . Rhus dermatitis   . Rhinitis     allergic nos  . HSV-2 infection    Past Surgical History  Procedure Laterality Date  . Tonsillectomy and adenoidectomy  1994   Family History  Problem Relation Age of Onset  . Hypertension Maternal Grandmother   . Cancer - Lung Maternal Grandfather   . Cancer Paternal Grandfather    Social History  Substance Use Topics  . Smoking status: Never Smoker   . Smokeless tobacco: Never Used  . Alcohol Use: Yes     Comment: less often than weekly; when not pregnant   ROS See pertinent in HPI  Blood pressure 119/74, pulse 84, resp. rate 16, height 5\' 5"  (1.651 m), weight 129 lb (58.514 kg), currently breastfeeding. GENERAL: Well-developed, well-nourished female in no acute distress.  ABDOMEN: Soft, nontender, nondistended. No organomegaly. PELVIC: Normal external female genitalia. Vagina is pink and rugated.  Normal discharge. Normal appearing cervix. Uterus is normal in size. No adnexal mass or tenderness. EXTREMITIES: No cyanosis, clubbing, or edema, 2+ distal pulses.  A/P 25 yo here for IUD removal PROCEDURE  The patient was placed in the dorsal lithotomy position. An adequate timeout was performed, attention was turned to her pelvis where a speculum was placed in the vagina.  The cervix was visualized. The strings of the IUD were visualized and grasped with a ring forceps. The IUD was grasped and removed in its entirety. The patient tolerated the procedure well.  She was advised  to start taking prenatal vitamins RTC in July for annyal exam

## 2016-02-23 ENCOUNTER — Ambulatory Visit: Payer: Managed Care, Other (non HMO) | Admitting: Family

## 2016-04-03 ENCOUNTER — Ambulatory Visit (INDEPENDENT_AMBULATORY_CARE_PROVIDER_SITE_OTHER): Payer: BLUE CROSS/BLUE SHIELD | Admitting: Family Medicine

## 2016-04-03 ENCOUNTER — Encounter: Payer: Self-pay | Admitting: Family Medicine

## 2016-04-03 VITALS — BP 111/71 | HR 97 | Wt 135.0 lb

## 2016-04-03 DIAGNOSIS — Z01419 Encounter for gynecological examination (general) (routine) without abnormal findings: Secondary | ICD-10-CM | POA: Diagnosis not present

## 2016-04-03 DIAGNOSIS — D239 Other benign neoplasm of skin, unspecified: Secondary | ICD-10-CM | POA: Diagnosis not present

## 2016-04-03 DIAGNOSIS — D229 Melanocytic nevi, unspecified: Secondary | ICD-10-CM

## 2016-04-03 LAB — LIPID PANEL
CHOL/HDL RATIO: 3.1 ratio (ref <=5.0)
CHOLESTEROL: 168 mg/dL (ref 125–200)
HDL: 55 mg/dL (ref >=46)
LDL Cholesterol: 105 mg/dL (ref <130)
Triglycerides: 41 mg/dL (ref <150)
VLDL: 8 mg/dL (ref <30)

## 2016-04-03 LAB — COMPLETE METABOLIC PANEL WITH GFR
ALBUMIN: 4.7 g/dL (ref 3.6–5.1)
ALK PHOS: 49 U/L (ref 33–115)
ALT: 14 U/L (ref 6–29)
AST: 14 U/L (ref 10–30)
BUN: 15 mg/dL (ref 7–25)
CALCIUM: 8.9 mg/dL (ref 8.6–10.2)
CO2: 25 mmol/L (ref 20–31)
Chloride: 105 mmol/L (ref 98–110)
Creat: 0.69 mg/dL (ref 0.50–1.10)
GFR, Est African American: >89 mL/min (ref >=60)
GFR, Est Non African American: >89 mL/min (ref >=60)
Glucose, Bld: 95 mg/dL (ref 65–99)
POTASSIUM: 4.2 mmol/L (ref 3.5–5.3)
Sodium: 139 mmol/L (ref 135–146)
Total Bilirubin: 0.2 mg/dL (ref 0.2–1.2)
Total Protein: 7.3 g/dL (ref 6.1–8.1)

## 2016-04-03 NOTE — Progress Notes (Signed)
All labs are normal. 

## 2016-04-03 NOTE — Progress Notes (Signed)
Subjective:     Annette Koch is a 25 y.o. female and is here for a comprehensive physical exam. The patient reports no problems. Mirena removed 2 mo ago.  Sees gyn for pap.    She does have a mole on her left upper back that she would like to have it removed. I have looked at it before but it sits right underneath her bra strap and gets irritated and inflamed. She has a larger one on her right upper back over the shoulder blade as well but she would like to have removed.   Social History   Social History  . Marital status: Married    Spouse name: N/A  . Number of children: N/A  . Years of education: N/A   Occupational History  . childcare giver [place] History Main Topics  . Smoking status: Never Smoker  . Smokeless tobacco: Never Used  . Alcohol use Yes     Comment: less often than weekly; when not pregnant  . Drug use: No  . Sexual activity: Yes    Partners: Male    Birth control/ protection: Condom, None, Pill   Other Topics Concern  . Not on file   Social History Narrative  . No narrative on file   Health Maintenance  Topic Date Due  . PAP SMEAR  03/04/2016  . INFLUENZA VACCINE  03/19/2016  . TETANUS/TDAP  06/01/2018  . HIV Screening  Completed    The following portions of the patient's history were reviewed and updated as appropriate: allergies, current medications, past family history, past medical history, past social history, past surgical history and problem list.  Review of Systems A comprehensive review of systems was negative.   Objective:    BP 111/71 (BP Location: Left Arm, Patient Position: Sitting, Cuff Size: Normal)   Pulse 97   Wt 135 lb (61.2 kg)   LMP 03/26/2016 (Approximate)   SpO2 99%   BMI 22.47 kg/m  General appearance: alert, cooperative and appears stated age Head: Normocephalic, without obvious abnormality, atraumatic Eyes: conj clear, EOMI, PEERLA Ears: normal TM's and external ear canals both ears Nose:  Nares normal. Septum midline. Mucosa normal. No drainage or sinus tenderness. Throat: lips, mucosa, and tongue normal; teeth and gums normal Neck: no adenopathy, no carotid bruit, no JVD, supple, symmetrical, trachea midline and thyroid not enlarged, symmetric, no tenderness/mass/nodules Back: symmetric, no curvature. ROM normal. No CVA tenderness. Lungs: clear to auscultation bilaterally Breasts: normal appearance, no masses or tenderness Heart: regular rate and rhythm, S1, S2 normal, no murmur, click, rub or gallop Abdomen: soft, non-tender; bowel sounds normal; no masses,  no organomegaly Extremities: extremities normal, atraumatic, no cyanosis or edema Pulses: 2+ and symmetric Skin: Skin color, texture, turgor normal. No rashes or lesions Lymph nodes: Cervical, supraclavicular, and axillary nodes normal. Neurologic: Alert and oriented X 3, normal strength and tone. Normal symmetric reflexes. Normal coordination and gait    Assessment:    Healthy female exam.      Plan:     See After Visit Summary for Counseling Recommendations   Keep up a regular exercise program and make sure you are eating a healthy diet Try to eat 4 servings of dairy a day, or if you are lactose intolerant take a calcium with vitamin D daily.  Your vaccines are up to date.   Atypical nevus - Recommend shave biopsy for removal. Will send to lab for pathology.  Shave Biopsy Procedure Note  Pre-operative  Diagnosis: atypical nevus  Post-operative Diagnosis: same  Locations:left upper back and right upper back  Indications: irritation  Anesthesia: Lidocaine 1% with epinephrine without added sodium bicarbonate  Procedure Details  Patient informed of the risks (including bleeding and infection) and benefits of the  procedure and Verbal informed consent obtained.  The lesion and surrounding area were given a sterile prep using chlorhexidine and draped in the usual sterile fashion. A scalpel was used to  shave an area of skin approximately 67mm by 72mm.  Hemostasis achieved with alumuninum chloride. Antibiotic ointment and a sterile dressing applied.  The specimen was sent for pathologic examination. The patient tolerated the procedure well.  EBL: 0 ml  Findings: Await pathology  Condition: Stable  Complications: none.  Plan: 1. Instructed to keep the wound dry and covered for 24-48h and clean thereafter. 2. Warning signs of infection were reviewed.   3. Recommended that the patient use OTC acetaminophen as needed for pain.  4. Return PRN.

## 2016-04-03 NOTE — Patient Instructions (Addendum)
Keep up a regular exercise program and make sure you are eating a healthy diet Try to eat 4 servings of dairy a day, or if you are lactose intolerant take a calcium with vitamin D daily.  Your vaccines are up to date.    Keep wound covered for close to 24 hours. Then okay to take shower. Do not scrub at the areas. Just had dry and apply Vaseline. You can keep covered for the first couple days, but you do not have to. Just keep it moisturized well with the Vaseline for at least 10 days.

## 2016-04-03 NOTE — Addendum Note (Signed)
Addended by: Teddy Spike on: 04/03/2016 12:06 PM   Modules accepted: Orders

## 2016-09-25 ENCOUNTER — Other Ambulatory Visit: Payer: Self-pay

## 2016-09-27 ENCOUNTER — Encounter: Payer: Self-pay | Admitting: Family Medicine

## 2016-10-24 ENCOUNTER — Other Ambulatory Visit (HOSPITAL_COMMUNITY): Payer: Self-pay | Admitting: Certified Nurse Midwife

## 2016-10-24 ENCOUNTER — Encounter (HOSPITAL_COMMUNITY): Payer: Self-pay | Admitting: Certified Nurse Midwife

## 2016-10-24 DIAGNOSIS — Z3689 Encounter for other specified antenatal screening: Secondary | ICD-10-CM

## 2016-10-24 DIAGNOSIS — Z3A21 21 weeks gestation of pregnancy: Secondary | ICD-10-CM

## 2016-11-04 ENCOUNTER — Encounter (HOSPITAL_COMMUNITY): Payer: Self-pay | Admitting: *Deleted

## 2016-11-05 ENCOUNTER — Other Ambulatory Visit (HOSPITAL_COMMUNITY): Payer: Self-pay | Admitting: Certified Nurse Midwife

## 2016-11-05 ENCOUNTER — Ambulatory Visit (HOSPITAL_COMMUNITY)
Admission: RE | Admit: 2016-11-05 | Discharge: 2016-11-05 | Disposition: A | Discharge status: Home / Self Care | Payer: BLUE CROSS/BLUE SHIELD | Source: Ambulatory Visit | Attending: Certified Nurse Midwife | Admitting: Certified Nurse Midwife

## 2016-11-05 DIAGNOSIS — Z363 Encounter for antenatal screening for malformations: Secondary | ICD-10-CM

## 2016-11-05 DIAGNOSIS — O0932 Supervision of pregnancy with insufficient antenatal care, second trimester: Secondary | ICD-10-CM | POA: Insufficient documentation

## 2016-11-05 DIAGNOSIS — Z3689 Encounter for other specified antenatal screening: Secondary | ICD-10-CM

## 2016-11-05 DIAGNOSIS — Z3A21 21 weeks gestation of pregnancy: Secondary | ICD-10-CM

## 2016-12-22 ENCOUNTER — Encounter (HOSPITAL_COMMUNITY): Payer: Self-pay | Admitting: *Deleted

## 2016-12-22 ENCOUNTER — Inpatient Hospital Stay (HOSPITAL_COMMUNITY)
Admission: AD | Admit: 2016-12-22 | Discharge: 2016-12-22 | Disposition: A | Discharge status: Home / Self Care | Payer: BLUE CROSS/BLUE SHIELD | Source: Ambulatory Visit | Attending: Obstetrics and Gynecology | Admitting: Obstetrics and Gynecology

## 2016-12-22 DIAGNOSIS — O2303 Infections of kidney in pregnancy, third trimester: Secondary | ICD-10-CM

## 2016-12-22 DIAGNOSIS — N12 Tubulo-interstitial nephritis, not specified as acute or chronic: Secondary | ICD-10-CM | POA: Insufficient documentation

## 2016-12-22 DIAGNOSIS — I7300 Raynaud's syndrome without gangrene: Secondary | ICD-10-CM | POA: Insufficient documentation

## 2016-12-22 DIAGNOSIS — O99413 Diseases of the circulatory system complicating pregnancy, third trimester: Secondary | ICD-10-CM | POA: Insufficient documentation

## 2016-12-22 DIAGNOSIS — O9989 Other specified diseases and conditions complicating pregnancy, childbirth and the puerperium: Secondary | ICD-10-CM

## 2016-12-22 DIAGNOSIS — Z3A28 28 weeks gestation of pregnancy: Secondary | ICD-10-CM | POA: Insufficient documentation

## 2016-12-22 HISTORY — DX: Unspecified abnormal cytological findings in specimens from vagina: R87.629

## 2016-12-22 LAB — CBC WITH DIFFERENTIAL/PLATELET
Basophils Absolute: 0 10*3/uL (ref 0.0–0.1)
Basophils Relative: 0 %
EOS ABS: 0.1 10*3/uL (ref 0.0–0.7)
Eosinophils Relative: 1 %
HCT: 38.4 % (ref 36.0–46.0)
Hemoglobin: 13.2 g/dL (ref 12.0–15.0)
LYMPHS ABS: 2.3 10*3/uL (ref 0.7–4.0)
Lymphocytes Relative: 12 %
MCH: 30.6 pg (ref 26.0–34.0)
MCHC: 34.4 g/dL (ref 30.0–36.0)
MCV: 88.9 fL (ref 78.0–100.0)
Monocytes Absolute: 0.8 10*3/uL (ref 0.1–1.0)
Monocytes Relative: 4 %
NEUTROS PCT: 83 %
Neutro Abs: 16.2 10*3/uL — ABNORMAL HIGH (ref 1.7–7.7)
PLATELETS: 225 10*3/uL (ref 150–400)
RBC: 4.32 MIL/uL (ref 3.87–5.11)
RDW: 14.1 % (ref 11.5–15.5)
WBC: 19.3 10*3/uL — AB (ref 4.0–10.5)

## 2016-12-22 LAB — URINALYSIS, ROUTINE W REFLEX MICROSCOPIC
BILIRUBIN URINE: NEGATIVE
Glucose, UA: NEGATIVE mg/dL
Ketones, ur: NEGATIVE mg/dL
Nitrite: NEGATIVE
Protein, ur: 100 mg/dL — AB
Specific Gravity, Urine: 1.014 (ref 1.005–1.030)
pH: 6 (ref 5.0–8.0)

## 2016-12-22 MED ORDER — CEFTRIAXONE SODIUM 1 G IJ SOLR
1.0000 g | Freq: Once | INTRAMUSCULAR | Status: AC
  Administered 2016-12-22: 1 g via INTRAMUSCULAR
  Filled 2016-12-22: qty 10

## 2016-12-22 MED ORDER — CEPHALEXIN 500 MG PO CAPS: 500.0000 mg | ORAL_CAPSULE | Freq: Four times a day (QID) | ORAL | 0 refills | Status: AC

## 2016-12-22 NOTE — MAU Note (Signed)
Pt reports she stared havin some burning with urination on Thursday. Noticed some blood also. Tried some "natural "  Remedies for a few days but tonight frequency increased and more pain especially on right side.

## 2016-12-22 NOTE — MAU Provider Note (Signed)
Chief Complaint:  Urinary Tract Infection   First Provider Initiated Contact with Patient 12/22/16 0148     HPI: Annette Koch is a 26 y.o. G3P1011 at [redacted]w[redacted]d who presents to maternity admissions reporting concern for UTI versus pyelonephritis. Has had UTI symptoms for several days that improved slightly with over-the-counter treatments (cranberry tablets and a "natural" remedy this supposed to have antibiotic properties), but developed right flank pain today.  Location: Right flank Quality: Sharp, dull, cramping Severity: 7/10 in pain scale Duration: Less than 24 hours Context: [redacted] weeks gestation, UTI symptoms Timing: Constant Modifying factors: No relationship to position. Hasn't tried anything for pain. Associated signs and symptoms: Positive for dysuria, hematuria, urgency, frequency. Negative for fever, chills, nausea, vomiting, vaginal bleeding, contractions or leaking of fluid. Good fetal movement.   Pregnancy Course: Uncomplicated. Gets prenatal care with home birth CNM.   [facility] Last seen in 2017.  Past Medical History:  Diagnosis Date  . HSV-2 infection   . Other abnormal Papanicolaou smear of cervix and cervical HPV(795.09)   . PVD (peripheral vascular disease) (HCC)    unspec  . Raynaud's disease   . Rhinitis    allergic nos  . Rhus dermatitis   . Vaginal Pap smear, abnormal    OB History  Gravida Para Term Preterm AB Living  3 1 1   1 1   SAB TAB Ectopic Multiple Live Births    1     1    # Outcome Date GA Lbr Len/2nd Weight Sex Delivery Anes PTL Lv  3 Current           2 Term 04/27/14 [redacted]w[redacted]d 00:44 / 02:06 7 lb 0.2 oz (3.181 kg) F Vag-Spont Local  LIV  1 TAB              Past Surgical History:  Procedure Laterality Date  . TONSILLECTOMY AND ADENOIDECTOMY  1994   Family History  Problem Relation Age of Onset  . Hypertension Maternal Grandmother   . Cancer - Lung Maternal Grandfather   . Cancer Paternal Grandfather     Social History  Substance Use Topics  . Smoking status: Never Smoker  . Smokeless tobacco: Never Used  . Alcohol use Yes     Comment: less often than weekly; when not pregnant   No Known Allergies No prescriptions prior to admission.    I have reviewed patient's Past Medical Hx, Surgical Hx, Family Hx, Social Hx, medications and allergies.   ROS:  Review of Systems  Constitutional: Negative for appetite change, chills and fever.  Gastrointestinal: Negative for abdominal pain, constipation, diarrhea, nausea and vomiting.  Genitourinary: Positive for dysuria, flank pain, frequency, hematuria and urgency. Negative for difficulty urinating, pelvic pain, vaginal bleeding and vaginal discharge.  Musculoskeletal: Negative for back pain and myalgias.    Physical Exam  Patient Vitals for the past 24 hrs:  BP Temp Pulse Resp Height Weight  12/22/16 0327 97/64 - 78 18 - -  12/22/16 0159 - - - - 5\' 5"  (1.651 m) 157 lb (71.2 kg)  12/22/16 0124 119/67 97.7 F (36.5 C) (!) 102 18 - -   Constitutional: Well-developed, well-nourished female in no acute distress.  Cardiovascular: normal rate Respiratory: normal effort GI: Abd soft, non-tender, gravid appropriate for gestational age. MS: Extremities nontender, no edema, normal ROM Neurologic: Alert and oriented x 4.  GU: Mild right CVAT.  Pelvic: Declined    FHT:  Baseline 145 , moderate variability,  accelerations present, no decelerations Contractions: Uterine irritability   Labs: Results for orders placed or performed during the hospital encounter of 12/22/16 (from the past 24 hour(s))  Urinalysis, Routine w reflex microscopic     Status: Abnormal   Collection Time: 12/22/16 12:55 AM  Result Value Ref Range   Color, Urine YELLOW YELLOW   APPearance HAZY (A) CLEAR   Specific Gravity, Urine 1.014 1.005 - 1.030   pH 6.0 5.0 - 8.0   Glucose, UA NEGATIVE NEGATIVE mg/dL   Hgb urine dipstick MODERATE (A) NEGATIVE   Bilirubin Urine  NEGATIVE NEGATIVE   Ketones, ur NEGATIVE NEGATIVE mg/dL   Protein, ur 100 (A) NEGATIVE mg/dL   Nitrite NEGATIVE NEGATIVE   Leukocytes, UA LARGE (A) NEGATIVE   RBC / HPF TOO NUMEROUS TO COUNT 0 - 5 RBC/hpf   WBC, UA TOO NUMEROUS TO COUNT 0 - 5 WBC/hpf   Bacteria, UA RARE (A) NONE SEEN   Squamous Epithelial / LPF 0-5 (A) NONE SEEN   Mucous PRESENT   CBC with Differential/Platelet     Status: Abnormal   Collection Time: 12/22/16  1:59 AM  Result Value Ref Range   WBC 19.3 (H) 4.0 - 10.5 K/uL   RBC 4.32 3.87 - 5.11 MIL/uL   Hemoglobin 13.2 12.0 - 15.0 g/dL   HCT 38.4 36.0 - 46.0 %   MCV 88.9 78.0 - 100.0 fL   MCH 30.6 26.0 - 34.0 pg   MCHC 34.4 30.0 - 36.0 g/dL   RDW 14.1 11.5 - 15.5 %   Platelets 225 150 - 400 K/uL   Neutrophils Relative % 83 %   Neutro Abs 16.2 (H) 1.7 - 7.7 K/uL   Lymphocytes Relative 12 %   Lymphs Abs 2.3 0.7 - 4.0 K/uL   Monocytes Relative 4 %   Monocytes Absolute 0.8 0.1 - 1.0 K/uL   Eosinophils Relative 1 %   Eosinophils Absolute 0.1 0.0 - 0.7 K/uL   Basophils Relative 0 %   Basophils Absolute 0.0 0.0 - 0.1 K/uL    Imaging:  No results found.  MAU Course: Orders Placed This Encounter  Procedures  . Culture, OB Urine  . Urinalysis, Routine w reflex microscopic  . CBC with Differential/Platelet   Meds ordered this encounter  Medications  . cefTRIAXone (ROCEPHIN) injection 1 g    Order Specific Question:   Antibiotic Indication:    Answer:   UTI    MDM: - Mild pyelonephritis in pregnancy without fever, nausea, vomiting. Patient is non-toxic-appearing and tolerating POs without difficulty. Discussed history, exam, labs with Dr. Glo Herring. Considering absence of fever, nausea, vomiting and that the patient is reliable for follow-up, he feels that she would be a good candidate for outpatient antibiotics after receiving a gram of Rocephin IM in maternity admissions. Discussed recommendations with patient and spouse. She agrees with plan of care and  agrees to return to maternity admissions for any worsening of condition or inability to keep down antibiotics.  Assessment: 1. Pyelonephritis affecting pregnancy in third trimester     Plan: Discharge home in stable conditionPer consult with Dr. Glo Herring.  Preterm Labor precautions and fetal kick counts. Pyelonephritis precautions. Increase fluids and rest. Tylenol when necessary for pain In-basket message sent to [facility] for follow-up visit in 48-72 hours. Follow-up [place] for [facility] [place] Follow up on 12/25/2016.   Specialty:  Obstetrics and Gynecology Why:  follow-up for kidney infection Contact information: [place] [address],  [address] [facility] ADMISSIONS Follow up.   Why:  As needed if symptoms worsen) fever higher than 100.4, nausea and vomiting, severe pain) Contact information: [address] [id number] [place] [phone]          Allergies as of 12/22/2016   No Known Allergies     Medication List    TAKE these medications   cephALEXin 500 MG capsule Commonly known as:  KEFLEX Take 1 capsule (500 mg total) by mouth 4 (four) times daily.       Tamala Julian, [place], [place] 12/22/2016 3:31 AM

## 2016-12-22 NOTE — Discharge Instructions (Signed)
Pyelonephritis, Adult Pyelonephritis is a kidney infection. The kidneys are the organs that filter a person's blood and move waste out of the bloodstream and into the urine. Urine passes from the kidneys, through the ureters, and into the bladder. There are two main types of pyelonephritis:  Infections that come on quickly without any warning (acute pyelonephritis).  Infections that last for a long period of time (chronic pyelonephritis). In most cases, the infection clears up with treatment and does not cause further problems. More severe infections or chronic infections can sometimes spread to the bloodstream or lead to other problems with the kidneys. What are the causes? This condition is usually caused by:  Bacteria traveling from the bladder to the kidney through infected urine. The urine in the bladder can become infected with bacteria from:  Bladder infection (cystitis).  Inflammation of the prostate gland (prostatitis).  Sexual intercourse, in females.  Bacteria traveling from the bloodstream to the kidney. What increases the risk? This condition is more likely to develop in:  Pregnant women.  Older people.  People who have diabetes.  People who have kidney stones or bladder stones.  People who have other abnormalities of the kidney or ureter.  People who have a catheter placed in the bladder.  People who have cancer.  People who are sexually active.  Women who use spermicides.  People who have had a prior urinary tract infection. What are the signs or symptoms? Symptoms of this condition include:  Frequent urination.  Strong or persistent urge to urinate.  Burning or stinging when urinating.  Abdominal pain.  Back pain.  Pain in the side or flank area.  Fever.  Chills.  Blood in the urine, or dark urine.  Nausea.  Vomiting. How is this diagnosed? This condition may be diagnosed based on:  Medical history and physical exam.  Urine  tests.  Blood tests. You may also have imaging tests of the kidneys, such as an ultrasound or CT scan. How is this treated? Treatment for this condition may depend on the severity of the infection.  If the infection is mild and is found early, you may be treated with antibiotic medicines taken by mouth. You will need to drink fluids to remain hydrated.  If the infection is more severe, you may need to stay in the hospital and receive antibiotics given directly into a vein through an IV tube. You may also need to receive fluids through an IV tube if you are not able to remain hydrated. After your hospital stay, you may need to take oral antibiotics for a period of time. Other treatments may be required, depending on the cause of the infection. Follow these instructions at home: Medicines   Take over-the-counter and prescription medicines only as told by your health care provider.  If you were prescribed an antibiotic medicine, take it as told by your health care provider. Do not stop taking the antibiotic even if you start to feel better. General instructions   Drink enough fluid to keep your urine clear or pale yellow.  Avoid caffeine, tea, and carbonated beverages. They tend to irritate the bladder.  Urinate often. Avoid holding in urine for long periods of time.  Urinate before and after sex.  After a bowel movement, women should cleanse from front to back. Use each tissue only once.  Keep all follow-up visits as told by your health care provider. This is important. Contact a health care provider if:  Your symptoms do not get better  after 2 days of treatment.  Your symptoms get worse.  You have a fever. Get help right away if:  You are unable to take your antibiotics or fluids.  You have shaking chills.  You vomit.  You have severe flank or back pain.  You have extreme weakness or fainting. This information is not intended to replace advice given to you by your  health care provider. Make sure you discuss any questions you have with your health care provider. Document Released: 08/05/2005 Document Revised: 01/11/2016 Document Reviewed: 11/28/2014 Elsevier Interactive Patient Education  2017 [employer].

## 2016-12-23 LAB — CULTURE, OB URINE

## 2016-12-25 ENCOUNTER — Ambulatory Visit (INDEPENDENT_AMBULATORY_CARE_PROVIDER_SITE_OTHER): Payer: Self-pay | Admitting: Obstetrics & Gynecology

## 2016-12-25 ENCOUNTER — Encounter: Payer: Self-pay | Admitting: Obstetrics & Gynecology

## 2016-12-25 VITALS — BP 107/67 | HR 84 | Temp 97.5°F | Resp 16 | Ht 65.0 in | Wt 159.0 lb

## 2016-12-25 DIAGNOSIS — N10 Acute tubulo-interstitial nephritis: Secondary | ICD-10-CM

## 2016-12-25 DIAGNOSIS — O2343 Unspecified infection of urinary tract in pregnancy, third trimester: Secondary | ICD-10-CM

## 2016-12-25 LAB — POCT URINALYSIS DIPSTICK
BILIRUBIN UA: NEGATIVE
Blood, UA: NEGATIVE
GLUCOSE UA: NEGATIVE
KETONES UA: NEGATIVE
LEUKOCYTES UA: NEGATIVE
Nitrite, UA: NEGATIVE
Protein, UA: NEGATIVE
Spec Grav, UA: 1.025 (ref 1.010–1.025)
Urobilinogen, UA: NEGATIVE E.U./dL — AB
pH, UA: 7 (ref 5.0–8.0)

## 2016-12-25 NOTE — Progress Notes (Signed)
Pt is receiving home pregnancy care with a Midwife.  She plans a home birth.  FHT today is 144 BPM  Urine dip is neg today

## 2016-12-25 NOTE — Progress Notes (Signed)
   GYNECOLOGY OFFICE VISIT NOTE  History:  26 y.o. G3P1011 here today for follow up after treatment of complicated UTI in MAU on 12/22/2016.  She gets prenatal care with home birth provider.  Reports no current back pain, abdominal pain, fevers or other systemic concerns.  No obstetric concerns.  Past Medical History:  Diagnosis Date  . HSV-2 infection   . Other abnormal Papanicolaou smear of cervix and cervical HPV(795.09)   . PVD (peripheral vascular disease) (HCC)    unspec  . Raynaud's disease   . Rhinitis    allergic nos  . Rhus dermatitis   . Vaginal Pap smear, abnormal     Past Surgical History:  Procedure Laterality Date  . TONSILLECTOMY AND ADENOIDECTOMY  1994    The following portions of the patient's history were reviewed and updated as appropriate: allergies, current medications, past family history, past medical history, past social history, past surgical history and problem list.     Review of Systems:  Pertinent items noted in HPI and remainder of comprehensive ROS otherwise negative.   Objective:  Physical Exam BP 107/67   Pulse 84   Temp 97.5 F (36.4 C) (Oral)   Resp 16   Ht 5\' 5"  (1.651 m)   Wt 159 lb (72.1 kg)   LMP 06/09/2016   Breastfeeding? No   BMI 26.46 kg/m  CONSTITUTIONAL: Well-developed, well-nourished female in no acute distress.  HENT:  Normocephalic, atraumatic. External right and left ear normal. Oropharynx is clear and moist EYES: Conjunctivae and EOM are normal. Pupils are equal, round, and reactive to light. No scleral icterus.  NECK: Normal range of motion, supple, no masses SKIN: Skin is warm and dry. No rash noted. Not diaphoretic. No erythema. No pallor. NEUROLOGIC: Alert and oriented to person, place, and time. Normal reflexes, muscle tone coordination. No cranial nerve deficit noted. PSYCHIATRIC: Normal mood and affect. Normal behavior. Normal judgment and thought content. CARDIOVASCULAR: Normal heart rate noted RESPIRATORY:  Effort and breath sounds normal, no problems with respiration noted ABDOMEN: Soft, gravid, nontender,no distention noted.  BACK: No CVAT  PELVIC: Deferred MUSCULOSKELETAL: Normal range of motion. No edema noted.  Labs and Imaging UA negative  Assessment & Plan:  1. UTI (urinary tract infection) in pregnancy in third trimester Complete Keflex course as directed. Follow up with OB provider. No other complaints or concerns.  Labor and fetal movement precautions reviewed.    Verita Schneiders, MD, [place] Attending [place], [facility] for [employer], [place]

## 2016-12-25 NOTE — Patient Instructions (Signed)
Return to clinic for any scheduled appointments or obstetric concerns, or go to MAU for evaluation  

## 2017-06-23 ENCOUNTER — Encounter (HOSPITAL_COMMUNITY): Payer: Self-pay

## 2018-10-06 ENCOUNTER — Ambulatory Visit (INDEPENDENT_AMBULATORY_CARE_PROVIDER_SITE_OTHER): Payer: 59 | Admitting: Family Medicine

## 2018-10-06 ENCOUNTER — Encounter: Payer: Self-pay | Admitting: Family Medicine

## 2018-10-06 VITALS — BP 125/77 | HR 93 | Ht 64.0 in | Wt 159.0 lb

## 2018-10-06 DIAGNOSIS — M25561 Pain in right knee: Secondary | ICD-10-CM

## 2018-10-06 DIAGNOSIS — G8929 Other chronic pain: Secondary | ICD-10-CM

## 2018-10-06 NOTE — Progress Notes (Signed)
   Subjective:    Patient ID: Annette Koch, female    DOB: 12-19-1990, 28 y.o.   MRN: [id number]  HPI 28 year old female is here today with her daughter and mention that she was having some problems with her right knee.  She said that she injured that right knee in high school when she was taking a dance class.  Since then intermittently she will have some pain and swelling.  More recently she woke up and it started swelling and becoming painful again but in the last 2 days is actually felt a little bit better.  She wanted to know if that we could get her a knee brace.  She had one previously but says that over time it just got worn out.   Review of Systems     Objective:   Physical Exam   Right knee with some swelling posteriorly.      Assessment & Plan:  Right knee pain - given brace for support that velcroes on. If not improving then can f/;u with sports medication.

## 2020-06-22 ENCOUNTER — Telehealth: Payer: Self-pay | Admitting: Family Medicine

## 2020-06-22 NOTE — Telephone Encounter (Signed)
LVM asking pt to either call or send a mychart with this information.

## 2020-06-22 NOTE — Telephone Encounter (Signed)
Please call patient and see if she has had a Pap smear recently.  The last one that we have on file for her was with Dr. Hulan Fray here in our building and it was 7 years ago which means she is very overdue.  If she has had 1 done somewhere lets call to get that report and if not then lets get her scheduled ASAP to get in for a Pap smear.

## 2020-07-04 NOTE — Telephone Encounter (Signed)
LVM asking pt to respond via my chart or to rtn call to office

## 2020-07-19 NOTE — Telephone Encounter (Signed)
Letter sent.

## 2021-04-12 ENCOUNTER — Ambulatory Visit (INDEPENDENT_AMBULATORY_CARE_PROVIDER_SITE_OTHER): Payer: 59 | Admitting: Medical-Surgical

## 2021-04-12 ENCOUNTER — Other Ambulatory Visit: Payer: Self-pay

## 2021-04-12 ENCOUNTER — Encounter: Payer: Self-pay | Admitting: Medical-Surgical

## 2021-04-12 VITALS — BP 115/83 | HR 130 | Temp 99.1°F | Resp 20 | Ht 64.0 in | Wt 159.0 lb

## 2021-04-12 DIAGNOSIS — S90121A Contusion of right lesser toe(s) without damage to nail, initial encounter: Secondary | ICD-10-CM

## 2021-04-12 MED ORDER — CEPHALEXIN 500 MG PO CAPS: 500.0000 mg | ORAL_CAPSULE | Freq: Two times a day (BID) | ORAL | 0 refills | Status: DC

## 2021-04-12 NOTE — Progress Notes (Signed)
  HPI with pertinent ROS:   CC: right foot pain, bleeding  HPI: Very pleasant 30 year old female presenting today for evaluation of right toe pain after dropping a 50 pound chicken feed barrel on her toe yesterday.  She was attempting to empty chicken feed into a barrel and unfortunately the barrel tipped, falling and landing diagonally across her right foot.  She understandably had immediate pain but was bleeding from her lateral nail fold of her great toe.  She has done icing and has kept it elevated.  She did soak it last night to remove the dried blood.  While soaking it and trying to clean up the site, she felt a little bit of a pop at the toenail and then noted that she still had bleeding oozing from under the lateral edge of the great toenail.  Her toe is not painful unless she tries to put weight on her foot or pressure on the toe.  Today, she is avoiding weightbearing on the foot and has it wrapped in an Ace wrap on arrival.  She does continue to have some bleeding at the site but this is very sluggish and has mostly clotted off along the lateral nail fold.  I reviewed the past medical history, family history, social history, surgical history, and allergies today and no changes were needed.  Please see the problem list section below in epic for further details.   Physical exam:   General: Well Developed, well nourished, and in no acute distress.  Neuro: Alert and oriented x3.  HEENT: Normocephalic, atraumatic.  Skin: Warm and dry. Cardiac: Regular rate and rhythm, no murmurs rubs or gallops, no lower extremity edema.  Respiratory: Clear to auscultation bilaterally. Not using accessory muscles, speaking in full sentences. RLE: Large ecchymosis involving more than 50% of the great toenail.  Clotted blood along the lateral nail fold, no active bleeding.  Tenderness to the right great toe.  Full range of motion to the ankle, forefoot, and remaining 4 toes.  Impression and Recommendations:     1. Traumatic ecchymosis of toe of right foot, initial encounter Site care completed with gauze and normal saline.  Clotted blood not dislodged to prevent further bleeding.  No active oozing at time of the appointment.  She does have quite a bit of blood buildup under her great toenail so I feel she will probably lose this completely.  Advised patient to trim her toenail very short and continue doing so as the traumatized area grows out.  No indication for toenail removal today.  She is newly pregnant with her fourth child so we will avoid x-raying today.  Triple antibiotic ointment applied to the site covered with a sterile Band-Aid.  Placed in postop shoe for comfort.  Advised continued soaks to clear the site but avoid disturbing clots and causing further bleeding over the next couple of days.  Keflex 500 mg twice daily x7 days sent to the pharmacy.  She is worried about taking an antibiotic when she is so early in her pregnancy so advised patient to avoid starting this unless she starts to develop signs and symptoms of infection.  Return if symptoms worsen or fail to improve. ___________________________________________ Clearnce Sorrel, DNP, APRN, FNP-BC [facility] [place]

## 2021-05-11 ENCOUNTER — Encounter: Payer: Self-pay | Admitting: Family Medicine

## 2021-05-11 ENCOUNTER — Other Ambulatory Visit: Payer: Self-pay

## 2021-05-11 ENCOUNTER — Ambulatory Visit: Payer: 59 | Admitting: Family Medicine

## 2021-05-11 VITALS — BP 122/73 | HR 84 | Ht 64.0 in | Wt 159.0 lb

## 2021-05-11 DIAGNOSIS — N3000 Acute cystitis without hematuria: Secondary | ICD-10-CM | POA: Diagnosis not present

## 2021-05-11 DIAGNOSIS — R300 Dysuria: Secondary | ICD-10-CM | POA: Diagnosis not present

## 2021-05-11 LAB — POCT URINALYSIS DIP (CLINITEK)
Bilirubin, UA: NEGATIVE
Glucose, UA: NEGATIVE mg/dL
Ketones, POC UA: NEGATIVE mg/dL
Nitrite, UA: NEGATIVE
POC PROTEIN,UA: NEGATIVE
Spec Grav, UA: 1.01 (ref 1.010–1.025)
Urobilinogen, UA: 0.2 E.U./dL
pH, UA: 5.5 (ref 5.0–8.0)

## 2021-05-11 MED ORDER — CEPHALEXIN 500 MG PO CAPS: 500.0000 mg | ORAL_CAPSULE | Freq: Two times a day (BID) | ORAL | 0 refills | Status: AC

## 2021-05-11 NOTE — Progress Notes (Signed)
Acute Office Visit  Subjective:    Patient ID: Annette Koch, female    DOB: 03-29-1991, 30 y.o.   MRN: [id number]  Chief Complaint  Patient presents with   Urinary Tract Infection    HPI Patient is in today for 2 days of dysuria.  No back pain or gross hematuria no fever sweats or chills no nausea.  She says the last time she had a UTI while pregnant it became a kidney infection very quickly and got very sick.  Past Medical History:  Diagnosis Date   HSV-2 infection    Other abnormal Papanicolaou smear of cervix and cervical HPV(795.09)    PVD (peripheral vascular disease) (HCC)    unspec   Raynaud's disease    Rhinitis    allergic nos   Rhus dermatitis    Vaginal Pap smear, abnormal     Past Surgical History:  Procedure Laterality Date   TONSILLECTOMY AND ADENOIDECTOMY  1994    Family History  Problem Relation Age of Onset   Hypertension Maternal Grandmother    Cancer - Lung Maternal Grandfather    Cancer Paternal Grandfather     Social History   Socioeconomic History   Marital status: Married    Spouse name: Not on file   Number of children: Not on file   Years of education: Not on file   Highest education level: Not on file  Occupational History   Occupation: childcare giver    Employer: [employer]  Tobacco Use   Smoking status: Never   Smokeless tobacco: Never  Substance and Sexual Activity   Alcohol use: Yes    Comment: less often than weekly; when not pregnant   Drug use: No   Sexual activity: Yes    Partners: Male    Birth control/protection: Condom, None, Pill  Other Topics Concern   Not on file  Social History Narrative   Not on file   Social Determinants of Health   Financial Resource Strain: Not on file  Food Insecurity: Not on file  Transportation Needs: Not on file  Physical Activity: Not on file  Stress: Not on file  Social Connections: Not on file  Intimate Partner Violence: Not on file    Outpatient Medications  Prior to Visit  Medication Sig Dispense Refill   Prenatal Vit-Fe Fumarate-FA (MULTIVITAMIN-PRENATAL) 27-0.8 MG TABS tablet Take 1 tablet by mouth daily at 12 noon.     cephALEXin (KEFLEX) 500 MG capsule Take 1 capsule (500 mg total) by mouth 2 (two) times daily. (Patient not taking: Reported on 05/11/2021) 14 capsule 0   No facility-administered medications prior to visit.    No Known Allergies  Review of Systems     Objective:    Physical Exam Vitals reviewed.  Constitutional:      Appearance: She is well-developed.  HENT:     Head: Normocephalic and atraumatic.  Eyes:     Conjunctiva/sclera: Conjunctivae normal.  Pulmonary:     Effort: Pulmonary effort is normal.  Abdominal:     General: There is no distension.     Palpations: Abdomen is soft.     Tenderness: There is no abdominal tenderness.  Musculoskeletal:     Comments: No CVA tenderness   Skin:    General: Skin is dry.     Coloration: Skin is not pale.  Neurological:     Mental Status: She is alert and oriented to person, place, and time.  Psychiatric:  Behavior: Behavior normal.    BP 122/73   Pulse 84   Ht 5\' 4"  (1.626 m)   Wt 159 lb (72.1 kg)   SpO2 100%   BMI 27.29 kg/m  Wt Readings from Last 3 Encounters:  05/11/21 159 lb (72.1 kg)  04/12/21 159 lb (72.1 kg)  10/06/18 159 lb (72.1 kg)    Health Maintenance Due  Topic Date Due   Hepatitis C Screening  Never done   PAP SMEAR-Modifier  06/29/2019   INFLUENZA VACCINE  Never done    There are no preventive care reminders to display for this patient.   Lab Results  Component Value Date   TSH 1.225 04/20/2009   Lab Results  Component Value Date   WBC 19.3 (H) 12/22/2016   HGB 13.2 12/22/2016   HCT 38.4 12/22/2016   MCV 88.9 12/22/2016   PLT 225 12/22/2016   Lab Results  Component Value Date   NA 139 04/03/2016   K 4.2 04/03/2016   CO2 25 04/03/2016   GLUCOSE 95 04/03/2016   BUN 15 04/03/2016   CREATININE 0.69 04/03/2016    BILITOT 0.2 04/03/2016   ALKPHOS 49 04/03/2016   AST 14 04/03/2016   ALT 14 04/03/2016   PROT 7.3 04/03/2016   ALBUMIN 4.7 04/03/2016   CALCIUM 8.9 04/03/2016   Lab Results  Component Value Date   CHOL 168 04/03/2016   Lab Results  Component Value Date   HDL 55 04/03/2016   Lab Results  Component Value Date   LDLCALC 105 04/03/2016   Lab Results  Component Value Date   TRIG 41 04/03/2016   Lab Results  Component Value Date   CHOLHDL 3.1 04/03/2016   No results found for: HGBA1C     Assessment & Plan:   Problem List Items Addressed This Visit   None Visit Diagnoses     Dysuria    -  Primary   Relevant Orders   POCT URINALYSIS DIP (CLINITEK) (Completed)   Acute cystitis without hematuria       Relevant Medications   cephALEXin (KEFLEX) 500 MG capsule      Urinary tract infection-we will go ahead and treat with Keflex as she is pregnant.  If not improving please let us know.  She is low risk so I did not send a culture today.  If not better or returns then please let me know.  Meds ordered this encounter  Medications   cephALEXin (KEFLEX) 500 MG capsule    Sig: Take 1 capsule (500 mg total) by mouth 2 (two) times daily.    Dispense:  6 capsule    Refill:  0     Beatrice Lecher, MD

## 2021-12-27 ENCOUNTER — Telehealth: Payer: Self-pay | Admitting: Family Medicine

## 2021-12-27 NOTE — Telephone Encounter (Signed)
Please call patient and see if she has had a Pap smear recently ?

## 2021-12-28 NOTE — Telephone Encounter (Signed)
LVM asking pt to either call back with this information or send my chart to let us know where and when she had this done.  ?
